# Patient Record
Sex: Male | Born: 1945 | Race: White | Hispanic: No | Marital: Married | Smoking: Never smoker
Health system: Southern US, Community
[De-identification: ages and names within clinical notes are randomized; demographics above are authoritative.]

## PROBLEM LIST (undated history)

## (undated) DIAGNOSIS — Z860100 Personal history of colon polyps, unspecified: Secondary | ICD-10-CM

## (undated) DIAGNOSIS — G250 Essential tremor: Secondary | ICD-10-CM

## (undated) DIAGNOSIS — N200 Calculus of kidney: Secondary | ICD-10-CM

## (undated) DIAGNOSIS — E785 Hyperlipidemia, unspecified: Secondary | ICD-10-CM

## (undated) DIAGNOSIS — M1990 Unspecified osteoarthritis, unspecified site: Secondary | ICD-10-CM

## (undated) DIAGNOSIS — G4730 Sleep apnea, unspecified: Secondary | ICD-10-CM

## (undated) DIAGNOSIS — N400 Enlarged prostate without lower urinary tract symptoms: Secondary | ICD-10-CM

## (undated) DIAGNOSIS — J9383 Other pneumothorax: Secondary | ICD-10-CM

## (undated) HISTORY — PX: TONSILLECTOMY: SUR1361

## (undated) HISTORY — DX: Personal history of colon polyps, unspecified: Z86.0100

## (undated) HISTORY — PX: VASECTOMY: SHX75

## (undated) HISTORY — DX: Other pneumothorax: J93.83

## (undated) HISTORY — DX: Essential tremor: G25.0

## (undated) HISTORY — DX: Personal history of colonic polyps: Z86.010

## (undated) HISTORY — DX: Calculus of kidney: N20.0

## (undated) HISTORY — DX: Benign prostatic hyperplasia without lower urinary tract symptoms: N40.0

## (undated) HISTORY — DX: Sleep apnea, unspecified: G47.30

## (undated) HISTORY — DX: Hyperlipidemia, unspecified: E78.5

## (undated) HISTORY — DX: Unspecified osteoarthritis, unspecified site: M19.90

---

## 1983-05-13 DIAGNOSIS — J9383 Other pneumothorax: Secondary | ICD-10-CM

## 1983-05-13 HISTORY — DX: Other pneumothorax: J93.83

## 2006-03-02 ENCOUNTER — Ambulatory Visit: Payer: Self-pay | Admitting: Family Medicine

## 2006-05-13 ENCOUNTER — Ambulatory Visit: Payer: Self-pay | Admitting: Family Medicine

## 2006-05-29 ENCOUNTER — Ambulatory Visit: Payer: Self-pay | Admitting: Gastroenterology

## 2006-06-12 ENCOUNTER — Encounter (INDEPENDENT_AMBULATORY_CARE_PROVIDER_SITE_OTHER): Payer: Self-pay | Admitting: *Deleted

## 2006-06-12 ENCOUNTER — Ambulatory Visit: Payer: Self-pay | Admitting: Gastroenterology

## 2006-06-12 LAB — HM COLONOSCOPY: HM Colonoscopy: NORMAL

## 2006-06-24 ENCOUNTER — Ambulatory Visit: Payer: Self-pay | Admitting: Family Medicine

## 2006-06-24 ENCOUNTER — Inpatient Hospital Stay (HOSPITAL_COMMUNITY): Admission: AD | Admit: 2006-06-24 | Discharge: 2006-06-26 | Payer: Self-pay | Admitting: Internal Medicine

## 2006-06-25 ENCOUNTER — Ambulatory Visit: Payer: Self-pay | Admitting: Internal Medicine

## 2006-06-27 ENCOUNTER — Emergency Department (HOSPITAL_COMMUNITY): Admission: EM | Admit: 2006-06-27 | Discharge: 2006-06-28 | Payer: Self-pay | Admitting: Emergency Medicine

## 2006-06-29 ENCOUNTER — Ambulatory Visit: Payer: Self-pay | Admitting: Family Medicine

## 2006-07-07 ENCOUNTER — Ambulatory Visit: Payer: Self-pay | Admitting: Internal Medicine

## 2006-09-01 ENCOUNTER — Encounter (INDEPENDENT_AMBULATORY_CARE_PROVIDER_SITE_OTHER): Payer: Self-pay | Admitting: Specialist

## 2006-09-01 ENCOUNTER — Ambulatory Visit (HOSPITAL_COMMUNITY): Admission: RE | Admit: 2006-09-01 | Discharge: 2006-09-01 | Payer: Self-pay | Admitting: Surgery

## 2006-09-01 HISTORY — PX: OTHER SURGICAL HISTORY: SHX169

## 2006-10-13 ENCOUNTER — Encounter: Payer: Self-pay | Admitting: *Deleted

## 2006-10-13 ENCOUNTER — Ambulatory Visit: Payer: Self-pay | Admitting: Family Medicine

## 2006-10-13 DIAGNOSIS — Z87442 Personal history of urinary calculi: Secondary | ICD-10-CM

## 2006-10-13 DIAGNOSIS — M069 Rheumatoid arthritis, unspecified: Secondary | ICD-10-CM | POA: Insufficient documentation

## 2006-10-13 DIAGNOSIS — Z87898 Personal history of other specified conditions: Secondary | ICD-10-CM

## 2006-11-20 ENCOUNTER — Ambulatory Visit: Payer: Self-pay | Admitting: Family Medicine

## 2006-12-08 ENCOUNTER — Encounter: Payer: Self-pay | Admitting: Family Medicine

## 2006-12-10 ENCOUNTER — Ambulatory Visit: Payer: Self-pay | Admitting: Family Medicine

## 2006-12-10 LAB — CONVERTED CEMR LAB
Nitrite: NEGATIVE
Specific Gravity, Urine: 1.025
WBC Urine, dipstick: NEGATIVE

## 2006-12-15 ENCOUNTER — Ambulatory Visit: Payer: Self-pay | Admitting: Family Medicine

## 2006-12-15 DIAGNOSIS — E785 Hyperlipidemia, unspecified: Secondary | ICD-10-CM | POA: Insufficient documentation

## 2006-12-15 DIAGNOSIS — N400 Enlarged prostate without lower urinary tract symptoms: Secondary | ICD-10-CM

## 2006-12-15 DIAGNOSIS — J309 Allergic rhinitis, unspecified: Secondary | ICD-10-CM | POA: Insufficient documentation

## 2006-12-15 DIAGNOSIS — Z860100 Personal history of colon polyps, unspecified: Secondary | ICD-10-CM | POA: Insufficient documentation

## 2006-12-15 LAB — CONVERTED CEMR LAB
AST: 21 units/L (ref 0–37)
Bilirubin, Direct: 0.1 mg/dL (ref 0.0–0.3)
Chloride: 103 meq/L (ref 96–112)
Eosinophils Absolute: 0.2 10*3/uL (ref 0.0–0.6)
Eosinophils Relative: 3.4 % (ref 0.0–5.0)
GFR calc non Af Amer: 81 mL/min
Glucose, Bld: 97 mg/dL (ref 70–99)
HCT: 40.5 % (ref 39.0–52.0)
Lymphocytes Relative: 30.7 % (ref 12.0–46.0)
MCV: 81.6 fL (ref 78.0–100.0)
Neutro Abs: 2.6 10*3/uL (ref 1.4–7.7)
Neutrophils Relative %: 55 % (ref 43.0–77.0)
PSA: 0.68 ng/mL (ref 0.10–4.00)
RBC: 4.96 M/uL (ref 4.22–5.81)
Sodium: 140 meq/L (ref 135–145)
Total Bilirubin: 0.6 mg/dL (ref 0.3–1.2)
Total Protein: 6.8 g/dL (ref 6.0–8.3)
WBC: 4.7 10*3/uL (ref 4.5–10.5)

## 2007-04-12 ENCOUNTER — Encounter: Payer: Self-pay | Admitting: Family Medicine

## 2007-12-07 ENCOUNTER — Ambulatory Visit: Payer: Self-pay | Admitting: Family Medicine

## 2007-12-07 LAB — CONVERTED CEMR LAB
Glucose, Urine, Semiquant: NEGATIVE
Nitrite: NEGATIVE
Protein, U semiquant: NEGATIVE
Urobilinogen, UA: 0.2
WBC Urine, dipstick: NEGATIVE

## 2007-12-08 LAB — CONVERTED CEMR LAB
ALT: 22 units/L (ref 0–53)
AST: 20 units/L (ref 0–37)
Albumin: 4.2 g/dL (ref 3.5–5.2)
Alkaline Phosphatase: 53 units/L (ref 39–117)
BUN: 22 mg/dL (ref 6–23)
Basophils Relative: 0.1 % (ref 0.0–3.0)
CO2: 32 meq/L (ref 19–32)
Chloride: 102 meq/L (ref 96–112)
Creatinine, Ser: 0.9 mg/dL (ref 0.4–1.5)
Eosinophils Relative: 4.4 % (ref 0.0–5.0)
LDL Cholesterol: 132 mg/dL — ABNORMAL HIGH (ref 0–99)
Lymphocytes Relative: 32.1 % (ref 12.0–46.0)
Neutrophils Relative %: 54.2 % (ref 43.0–77.0)
PSA: 0.7 ng/mL (ref 0.10–4.00)
Potassium: 4.3 meq/L (ref 3.5–5.1)
RBC: 4.75 M/uL (ref 4.22–5.81)
Total Bilirubin: 0.6 mg/dL (ref 0.3–1.2)
Total CHOL/HDL Ratio: 5.5
VLDL: 19 mg/dL (ref 0–40)
WBC: 4.5 10*3/uL (ref 4.5–10.5)

## 2007-12-16 ENCOUNTER — Ambulatory Visit: Payer: Self-pay | Admitting: Family Medicine

## 2008-01-04 ENCOUNTER — Telehealth: Payer: Self-pay | Admitting: Family Medicine

## 2008-03-10 ENCOUNTER — Ambulatory Visit: Payer: Self-pay | Admitting: Family Medicine

## 2008-03-10 DIAGNOSIS — J029 Acute pharyngitis, unspecified: Secondary | ICD-10-CM

## 2008-05-11 ENCOUNTER — Ambulatory Visit: Payer: Self-pay | Admitting: Family Medicine

## 2008-05-11 DIAGNOSIS — G250 Essential tremor: Secondary | ICD-10-CM

## 2008-05-11 DIAGNOSIS — G252 Other specified forms of tremor: Secondary | ICD-10-CM

## 2008-05-11 DIAGNOSIS — J019 Acute sinusitis, unspecified: Secondary | ICD-10-CM

## 2008-05-11 DIAGNOSIS — G4733 Obstructive sleep apnea (adult) (pediatric): Secondary | ICD-10-CM | POA: Insufficient documentation

## 2008-05-26 ENCOUNTER — Ambulatory Visit: Payer: Self-pay | Admitting: Pulmonary Disease

## 2008-05-28 ENCOUNTER — Encounter: Payer: Self-pay | Admitting: Pulmonary Disease

## 2008-05-28 ENCOUNTER — Ambulatory Visit (HOSPITAL_BASED_OUTPATIENT_CLINIC_OR_DEPARTMENT_OTHER): Admission: RE | Admit: 2008-05-28 | Discharge: 2008-05-28 | Payer: Self-pay | Admitting: Pulmonary Disease

## 2008-06-06 ENCOUNTER — Encounter: Payer: Self-pay | Admitting: Family Medicine

## 2008-06-09 ENCOUNTER — Ambulatory Visit: Payer: Self-pay | Admitting: Pulmonary Disease

## 2008-06-13 ENCOUNTER — Telehealth (INDEPENDENT_AMBULATORY_CARE_PROVIDER_SITE_OTHER): Payer: Self-pay | Admitting: *Deleted

## 2008-06-15 ENCOUNTER — Ambulatory Visit: Payer: Self-pay | Admitting: Pulmonary Disease

## 2008-07-26 ENCOUNTER — Telehealth: Payer: Self-pay | Admitting: Family Medicine

## 2008-07-31 ENCOUNTER — Telehealth: Payer: Self-pay | Admitting: Pulmonary Disease

## 2008-09-19 ENCOUNTER — Encounter: Payer: Self-pay | Admitting: Family Medicine

## 2009-01-02 ENCOUNTER — Ambulatory Visit: Payer: Self-pay | Admitting: Family Medicine

## 2009-01-02 LAB — CONVERTED CEMR LAB
Ketones, urine, test strip: NEGATIVE
Nitrite: NEGATIVE
Protein, U semiquant: NEGATIVE
Urobilinogen, UA: 0.2

## 2009-01-03 LAB — CONVERTED CEMR LAB
ALT: 24 units/L (ref 0–53)
Alkaline Phosphatase: 48 units/L (ref 39–117)
BUN: 25 mg/dL — ABNORMAL HIGH (ref 6–23)
Basophils Relative: 0 % (ref 0.0–3.0)
Bilirubin, Direct: 0 mg/dL (ref 0.0–0.3)
Calcium: 9.2 mg/dL (ref 8.4–10.5)
Chloride: 103 meq/L (ref 96–112)
Cholesterol: 194 mg/dL (ref 0–200)
Creatinine, Ser: 1 mg/dL (ref 0.4–1.5)
Eosinophils Relative: 2.9 % (ref 0.0–5.0)
GFR calc non Af Amer: 80.27 mL/min (ref >60)
HDL: 36.8 mg/dL — ABNORMAL LOW (ref >39.00)
LDL Cholesterol: 139 mg/dL — ABNORMAL HIGH (ref 0–99)
Lymphocytes Relative: 28.6 % (ref 12.0–46.0)
Monocytes Absolute: 0.5 10*3/uL (ref 0.1–1.0)
Monocytes Relative: 9.4 % (ref 3.0–12.0)
Neutrophils Relative %: 59.1 % (ref 43.0–77.0)
PSA: 0.8 ng/mL (ref 0.10–4.00)
Platelets: 212 10*3/uL (ref 150.0–400.0)
RBC: 4.98 M/uL (ref 4.22–5.81)
Total Bilirubin: 0.9 mg/dL (ref 0.3–1.2)
Total CHOL/HDL Ratio: 5
Total Protein: 7.2 g/dL (ref 6.0–8.3)
Triglycerides: 90 mg/dL (ref 0.0–149.0)
VLDL: 18 mg/dL (ref 0.0–40.0)
WBC: 4.9 10*3/uL (ref 4.5–10.5)

## 2009-01-10 ENCOUNTER — Ambulatory Visit: Payer: Self-pay | Admitting: Family Medicine

## 2009-04-11 ENCOUNTER — Telehealth: Payer: Self-pay | Admitting: Family Medicine

## 2009-06-22 ENCOUNTER — Ambulatory Visit: Payer: Self-pay | Admitting: Family Medicine

## 2009-06-22 DIAGNOSIS — IMO0002 Reserved for concepts with insufficient information to code with codable children: Secondary | ICD-10-CM

## 2009-06-22 DIAGNOSIS — S335XXA Sprain of ligaments of lumbar spine, initial encounter: Secondary | ICD-10-CM

## 2009-11-26 ENCOUNTER — Telehealth: Payer: Self-pay | Admitting: Family Medicine

## 2010-02-05 ENCOUNTER — Telehealth: Payer: Self-pay | Admitting: Family Medicine

## 2010-02-07 ENCOUNTER — Ambulatory Visit: Payer: Self-pay | Admitting: Family Medicine

## 2010-02-07 LAB — CONVERTED CEMR LAB
Blood in Urine, dipstick: NEGATIVE
Ketones, urine, test strip: NEGATIVE
Nitrite: NEGATIVE
Protein, U semiquant: NEGATIVE
Specific Gravity, Urine: 1.015
WBC Urine, dipstick: NEGATIVE
pH: 6.5

## 2010-02-11 LAB — CONVERTED CEMR LAB
Albumin: 4.4 g/dL (ref 3.5–5.2)
Alkaline Phosphatase: 56 units/L (ref 39–117)
Basophils Absolute: 0 10*3/uL (ref 0.0–0.1)
Bilirubin, Direct: 0.1 mg/dL (ref 0.0–0.3)
CO2: 34 meq/L — ABNORMAL HIGH (ref 19–32)
Calcium: 9.3 mg/dL (ref 8.4–10.5)
Creatinine, Ser: 1.2 mg/dL (ref 0.4–1.5)
Eosinophils Absolute: 0.2 10*3/uL (ref 0.0–0.7)
GFR calc non Af Amer: 64.81 mL/min (ref >60)
Glucose, Bld: 100 mg/dL — ABNORMAL HIGH (ref 70–99)
HDL: 41.8 mg/dL (ref >39.00)
Lymphocytes Relative: 26.1 % (ref 12.0–46.0)
MCHC: 34.2 g/dL (ref 30.0–36.0)
Monocytes Relative: 9.4 % (ref 3.0–12.0)
Neutrophils Relative %: 61.2 % (ref 43.0–77.0)
RDW: 14.1 % (ref 11.5–14.6)
Total Bilirubin: 0.8 mg/dL (ref 0.3–1.2)
Total CHOL/HDL Ratio: 5
Triglycerides: 123 mg/dL (ref 0.0–149.0)
VLDL: 24.6 mg/dL (ref 0.0–40.0)

## 2010-02-14 ENCOUNTER — Ambulatory Visit: Payer: Self-pay | Admitting: Family Medicine

## 2010-02-14 ENCOUNTER — Encounter: Payer: Self-pay | Admitting: Family Medicine

## 2010-02-15 ENCOUNTER — Ambulatory Visit: Payer: Self-pay | Admitting: Family Medicine

## 2010-02-15 ENCOUNTER — Telehealth: Payer: Self-pay | Admitting: Family Medicine

## 2010-03-05 ENCOUNTER — Encounter: Payer: Self-pay | Admitting: Family Medicine

## 2010-03-14 ENCOUNTER — Telehealth (INDEPENDENT_AMBULATORY_CARE_PROVIDER_SITE_OTHER): Payer: Self-pay | Admitting: *Deleted

## 2010-03-18 ENCOUNTER — Ambulatory Visit: Payer: Self-pay | Admitting: Family Medicine

## 2010-04-30 ENCOUNTER — Telehealth: Payer: Self-pay | Admitting: Family Medicine

## 2010-04-30 DIAGNOSIS — M79609 Pain in unspecified limb: Secondary | ICD-10-CM

## 2010-05-12 HISTORY — PX: BUNIONECTOMY: SHX129

## 2010-06-11 NOTE — Progress Notes (Signed)
Summary: please return his call  Phone Note Call from Patient Call back at [phone]   Caller: Patient---live call Reason for Call: Talk to Nurse, Lab or Test Results Summary of Call: wants Almira Coaster to call him regarding his lab results that he picked up from the office. Initial call taken by: Warnell Forester,  March 14, 2010 4:46 PM  Follow-up for Phone Call        Pt wants info on his lipomed results.  Follow-up by: Romualdo Bolk, CMA Duncan Dull),  March 15, 2010 8:59 AM  Additional Follow-up for Phone Call Additional follow up Details #1::        this is very difficult to discuss over the phone. If he would like to make an appt. I would be able to go over it with him  Additional Follow-up by: Nelwyn Salisbury MD,  March 15, 2010 11:01 AM    Additional Follow-up for Phone Call Additional follow up Details #2::    FYI: Pt states the order says non fasting. Pt states this is not true he fasted for 15 hours. Will schedule pt an appt. Follow-up by: Josph Macho RMA,  March 15, 2010 11:33 AM

## 2010-06-11 NOTE — Progress Notes (Signed)
Summary: Pt wants Electrophoresis lab done?  Phone Note Call from Patient Call back at Home Phone [phone] Call back at [phone]   Caller: Patient---voice mail Summary of Call: scheduled for cpx labs this thursday. requesting to have a special test to determined the particle size of the LDL. Has had high LDL reading in the past. Initial call taken by: Warnell Forester,  February 05, 2010 10:12 AM  Follow-up for Phone Call        Pt called today requesting Protein Electrophoresis be added to labs to be done this thursday due to High LDL readings. Do you want to add lab work? Pt requesting a call back on cell [phone] Follow-up by: Kathrynn Speed CMA,  February 05, 2010 11:59 AM  Additional Follow-up for Phone Call Additional follow up Details #1::        I do not order these because I do not find them useful. They are theoreticaly useful for people with normal LDLs that are deceptively dangerous. His LDLs are already known to be high, so we can discuss whether to start on meds for this or not. This extra test would not really help [place]  Additional Follow-up by: Nelwyn Salisbury MD,  February 05, 2010 1:19 PM    Additional Follow-up for Phone Call Additional follow up Details #2::    pt notiifed. Follow-up by: Pura Spice, RN,  February 06, 2010 8:03 AM

## 2010-06-11 NOTE — Assessment & Plan Note (Signed)
Summary: Follow up on labs/ CF   Vital Signs:  Patient profile:   65 year old male Pulse rate:   74 / minute BP sitting:   134 / 84  (left arm)  Vitals Entered By: Pura Spice, RN (March 18, 2010 1:36 PM) CC: discuss labs   History of Present Illness: Here to discuss results from a recent lipoprofile test he did, and to revisit out discussion about whether to go on a statin or not. He continues to exercise and eat healthily. His profile showed his LDL particle number to be a little high, but the particle size and density is large and fluffy, which is a good trend.    Allergies: No Known Drug Allergies  Past History:  Past Medical History: Reviewed history from 01/10/2009 and no changes required. Allergic rhinitis Hyperlipidemia Benign prostatic hypertrophy degenerative arthritis Colonic polyps, hx of Nephrolithiasis, hx of spontaneous pneumothorax 1985 sees Dr. Terri Piedra for skin checks sleep apnea, sees Dr. Marcelyn Bruins right arm essential tremor, sees Dr. Lesia Sago  Review of Systems  The patient denies anorexia, fever, weight loss, weight gain, vision loss, decreased hearing, hoarseness, chest pain, syncope, dyspnea on exertion, peripheral edema, prolonged cough, headaches, hemoptysis, abdominal pain, melena, hematochezia, severe indigestion/heartburn, hematuria, incontinence, genital sores, muscle weakness, suspicious skin lesions, transient blindness, difficulty walking, depression, unusual weight change, abnormal bleeding, enlarged lymph nodes, angioedema, breast masses, and testicular masses.    Physical Exam  General:  Well-developed,well-nourished,in no acute distress; alert,appropriate and cooperative throughout examination Lungs:  Normal respiratory effort, chest expands symmetrically. Lungs are clear to auscultation, no crackles or wheezes. Heart:  Normal rate and regular rhythm. S1 and S2 normal without gallop, murmur, click, rub or other extra  sounds.   Impression & Recommendations:  Problem # 1:  HYPERLIPIDEMIA (ICD-272.4)  Complete Medication List: 1)  Multivitamins Tabs (Multiple vitamin) .... Once daily 2)  Flonase 50 Mcg/act Susp (Fluticasone propionate) .... 2 sprays each nostril once daily 3)  Saw Palmetto 80 Mg Caps (Saw palmetto (serenoa repens)) 4)  Omega 3 Fish Oil  5)  Max Glucosamine Chondroitin 500-400 Mg Tabs (Glucosamine-chondroitin) .... Two times a day  Patient Instructions: 1)  After weighing the pros and cons we decided together that we would not start on meds at this time. recheck as scheduled   Orders Added: 1)  Est. Patient Level III [60454]

## 2010-06-11 NOTE — Assessment & Plan Note (Signed)
Summary: cpx/njr   Vital Signs:  Patient profile:   65 year old male Height:      70 inches Weight:      188 pounds O2 Sat:      98 % Temp:     97.9 degrees F Pulse rate:   54 / minute BP sitting:   130 / 82  (left arm) Cuff size:   large  Vitals Entered By: Pura Spice, RN (February 14, 2010 10:47 AM) CC: cpx refill flonase    History of Present Illness: 65 yr old male for a cpx. He feels fine and has no complaints. He has had a mildly elevated LDL in the 130s for years despite watching his diet very closely. He is interested in getting a lipoprotein electrophoresis study done to further assess his risk.   Allergies: No Known Drug Allergies  Past History:  Past Medical History: Reviewed history from 01/10/2009 and no changes required. Allergic rhinitis Hyperlipidemia Benign prostatic hypertrophy degenerative arthritis Colonic polyps, hx of Nephrolithiasis, hx of spontaneous pneumothorax 1985 sees Dr. Terri Piedra for skin checks sleep apnea, sees Dr. Marcelyn Bruins right arm essential tremor, sees Dr. Lesia Sago  Past Surgical History: Reviewed history from 12/16/2007 and no changes required. Tonsillectomy excision of lipoma from left neck 09-01-06 per Dr. Michaell Cowing Vasectomy Colonoscopy 06/12/06 per Dr. Jarold Motto, repeat in 5 yrs  Family History: Reviewed history from 12/16/2007 and no changes required. Rheumatoid arthritis & osteo arthritis Family History of Alcoholism/Addiction Family History of CAD Male 1st degree relative <50 Family History Hypertension Mesenteric Caner  Social History: Reviewed history from 12/16/2007 and no changes required. Married Former smoker Alcohol use-yes Drug use-no Occupation:professor at [employer], retired Personnel officer  Review of Systems  The patient denies anorexia, fever, weight loss, weight gain, vision loss, decreased hearing, hoarseness, chest pain, syncope, dyspnea on exertion, peripheral edema, prolonged cough, headaches,  hemoptysis, abdominal pain, melena, hematochezia, severe indigestion/heartburn, hematuria, incontinence, genital sores, muscle weakness, suspicious skin lesions, transient blindness, difficulty walking, depression, unusual weight change, abnormal bleeding, enlarged lymph nodes, angioedema, breast masses, and testicular masses.         Flu Vaccine Consent Questions     Do you have a history of severe allergic reactions to this vaccine? no    Any prior history of allergic reactions to egg and/or gelatin? no    Do you have a sensitivity to the preservative Thimersol? no    Do you have a past history of Guillan-Barre Syndrome? no    Do you currently have an acute febrile illness? no    Have you ever had a severe reaction to latex? no    Vaccine information given and explained to patient? yes    Are you currently pregnant? no    Lot Number:[id number]   Exp Date:11/09/2010   Site Given  Left Deltoid IM Pura Spice, RN  February 14, 2010 10:53 AM    Physical Exam  General:  Well-developed,well-nourished,in no acute distress; alert,appropriate and cooperative throughout examination Head:  Normocephalic and atraumatic without obvious abnormalities. No apparent alopecia or balding. Eyes:  No corneal or conjunctival inflammation noted. EOMI. Perrla. Funduscopic exam benign, without hemorrhages, exudates or papilledema. Vision grossly normal. Ears:  External ear exam shows no significant lesions or deformities.  Otoscopic examination reveals clear canals, tympanic membranes are intact bilaterally without bulging, retraction, inflammation or discharge. Hearing is grossly normal bilaterally. Nose:  External nasal examination shows no deformity or inflammation. Nasal mucosa are pink and moist  without lesions or exudates. Mouth:  Oral mucosa and oropharynx without lesions or exudates.  Teeth in good repair. Neck:  No deformities, masses, or tenderness noted. Chest Wall:  No deformities, masses, tenderness  or gynecomastia noted. Lungs:  Normal respiratory effort, chest expands symmetrically. Lungs are clear to auscultation, no crackles or wheezes. Heart:  Normal rate and regular rhythm. S1 and S2 normal without gallop, murmur, click, rub or other extra sounds. EKG normal Abdomen:  Bowel sounds positive,abdomen soft and non-tender without masses, organomegaly or hernias noted. Rectal:  No external abnormalities noted. Normal sphincter tone. No rectal masses or tenderness. Heme neg.  Genitalia:  Testes bilaterally descended without nodularity, tenderness or masses. No scrotal masses or lesions. No penis lesions or urethral discharge. Prostate:  no nodules, no asymmetry, no induration, and 1+ enlarged.   Msk:  No deformity or scoliosis noted of thoracic or lumbar spine.   Pulses:  R and L carotid,radial,femoral,dorsalis pedis and posterior tibial pulses are full and equal bilaterally Extremities:  No clubbing, cyanosis, edema, or deformity noted with normal full range of motion of all joints.   Neurologic:  No cranial nerve deficits noted. Station and gait are normal. Plantar reflexes are down-going bilaterally. DTRs are symmetrical throughout. Sensory, motor and coordinative functions appear intact. Skin:  Intact without suspicious lesions or rashes Cervical Nodes:  No lymphadenopathy noted Axillary Nodes:  No palpable lymphadenopathy Inguinal Nodes:  No significant adenopathy Psych:  Cognition and judgment appear intact. Alert and cooperative with normal attention span and concentration. No apparent delusions, illusions, hallucinations   Impression & Recommendations:  Problem # 1:  EXAMINATION, ROUTINE MEDICAL (ICD-V70.0)  Orders: EKG w/ Interpretation (93000) Hemoccult Guaiac-1 spec.(in office) (82270)  Complete Medication List: 1)  Multivitamins Tabs (Multiple vitamin) .... Once daily 2)  Flonase 50 Mcg/act Susp (Fluticasone propionate) .... 2 sprays each nostril once daily 3)  Saw  Palmetto 80 Mg Caps (Saw palmetto (serenoa repens)) 4)  Omega 3 Fish Oil  5)  Max Glucosamine Chondroitin 500-400 Mg Tabs (Glucosamine-chondroitin) .... Two times a day  Other Orders: Flu Vaccine 55yrs + MEDICARE PATIENTS (M8413) Administration Flu vaccine - MCR (K4401)  Patient Instructions: 1)  he will set up a lipoprotein electrophoresis study soon Prescriptions: FLONASE 50 MCG/ACT SUSP (FLUTICASONE PROPIONATE) 2 sprays each nostril once daily  #30 x 11   Entered and Authorized by:   Nelwyn Salisbury MD   Signed by:   Nelwyn Salisbury MD on 02/14/2010   Method used:   Electronically to        [facility] [address] (retail)       [address]       [place], [place]  [address]       Ph: [phone]       Fax: [phone]   RxID:   [id number]

## 2010-06-11 NOTE — Progress Notes (Signed)
Summary: Call A Nurse   Call-A-Nurse Triage Call Report Triage Record Num: [id number] Operator: Dayton Martes Patient Name: Louis Ortega Call Date & Time: 02/14/2010 5:13:19PM Patient Phone: [phone] PCP: Patient Gender: Male PCP Fax : Patient DOB: May 02, 1946 Practice Name: [facility] Reason for Call: Pt sts that he is having blood work done in the am and has to fast. Pt wants to know if he can take Advil PM to help him sleep. RN adv that pt may take meds as dir. Protocol(s) Used: Office Note Recommended Outcome per Protocol: Information Noted and Sent to Office Reason for Outcome: Caller information to office Care Advice:

## 2010-06-11 NOTE — Progress Notes (Signed)
Summary: Severe Abdominal Pain  Phone Note Call from Patient Call back at Home Phone [phone]   Caller: Patient Summary of Call: Has been taking advil pm to help with sleeping.  Severe hunger pain in stomach this morning, ate breakfast no relief also felt dizzy.  Is this something I should just monitor or pursue aggressively?  Initial call taken by: Trixie Dredge,  November 26, 2009 2:15 PM  Follow-up for Phone Call        how is he now? Follow-up by: Nelwyn Salisbury MD,  November 26, 2009 5:19 PM  Additional Follow-up for Phone Call Additional follow up Details #1::        Better now, but still uncomfortable. BM's ok. Pain epigastric. stop advil, take Prilosec OTC and come in if no better. Additional Follow-up by: Raechel Ache, RN,  November 26, 2009 5:25 PM

## 2010-06-11 NOTE — Assessment & Plan Note (Signed)
Summary: pain in groin/hip and lower leg/cjr   Vital Signs:  Patient profile:   65 year old male Weight:      193 pounds BMI:     27.79 O2 Sat:      95 % Temp:     98 degrees F Pulse rate:   70 / minute BP sitting:   136 / 80  Vitals Entered By: Pura Spice, RN (June 22, 2009 8:59 AM) CC: c/o pain rt hip ,groin area. onset 4 days. stated exercised differently on sunday and noticed pain on monday Is Patient Diabetic? No   History of Present Illness: One week ago he had the sudden onset of sharp pains in the right lower back and the right groin. This started the am after he dd  a workout including lower body lunges. This particular workout he rushed through because he was short on time. Each day since the pains have been slowly improving. Motrin helps.   Preventive Screening-Counseling & Management  Alcohol-Tobacco     Year Quit: 1989  Allergies (verified): No Known Drug Allergies  Past History:  Past Medical History: Reviewed history from 01/10/2009 and no changes required. Allergic rhinitis Hyperlipidemia Benign prostatic hypertrophy degenerative arthritis Colonic polyps, hx of Nephrolithiasis, hx of spontaneous pneumothorax 1985 sees Dr. Terri Piedra for skin checks sleep apnea, sees Dr. Marcelyn Bruins right arm essential tremor, sees Dr. Lesia Sago  Past Surgical History: Reviewed history from 12/16/2007 and no changes required. Tonsillectomy excision of lipoma from left neck 09-01-06 per Dr. Michaell Cowing Vasectomy Colonoscopy 06/12/06 per Dr. Jarold Motto, repeat in 5 yrs  Review of Systems  The patient denies anorexia, fever, weight loss, weight gain, vision loss, decreased hearing, hoarseness, chest pain, syncope, dyspnea on exertion, peripheral edema, prolonged cough, headaches, hemoptysis, abdominal pain, melena, hematochezia, severe indigestion/heartburn, hematuria, incontinence, genital sores, muscle weakness, suspicious skin lesions, transient blindness, difficulty  walking, depression, unusual weight change, abnormal bleeding, enlarged lymph nodes, angioedema, breast masses, and testicular masses.    Physical Exam  General:  Well-developed,well-nourished,in no acute distress; alert,appropriate and cooperative throughout examination Msk:  tender in the right lower back and the right groin. No masses. Full ROM.   Impression & Recommendations:  Problem # 1:  GROIN STRAIN (ICD-848.8)  Problem # 2:  LUMBAR STRAIN (ICD-847.2)  Complete Medication List: 1)  Multivitamins Tabs (Multiple vitamin) .... Once daily 2)  Flonase 50 Mcg/act Susp (Fluticasone propionate) .... 2 sprays each nostril once daily 3)  Saw Palmetto 80 Mg Caps (Saw palmetto (serenoa repens)) 4)  Omega 3 Fish Oil   Patient Instructions: 1)  These should heal with time. take it easy this weekend. ease back into exercising slowly.

## 2010-06-13 NOTE — Progress Notes (Signed)
Summary: REQUEST FOR REFERRAL?  Phone Note Call from Patient   Caller: Patient   [phone] Summary of Call: Pt would like a referral to a Podiatrist in the [facility]...Marland KitchenMarland Kitchen Pt has had a problem with his foot hurting and has had edema / redness / inflammation to site (side of foot) - bunion pain?... Pt can be reached at  [phone].  Initial call taken by: Debbra Riding,  April 30, 2010 11:17 AM  Follow-up for Phone Call        refer to Dr. Orlene Och  Follow-up by: Nelwyn Salisbury MD,  May 01, 2010 8:40 AM  Additional Follow-up for Phone Call Additional follow up Details #1::        referral sent to terri  Additional Follow-up by: Pura Spice, RN,  May 01, 2010 9:28 AM  New Problems: FOOT PAIN (ICD-729.5)   New Problems: FOOT PAIN (ICD-729.5)

## 2010-09-24 NOTE — Procedures (Signed)
NAME:  GUSSIE, TOWSON NO.:  [id number]   MEDICAL RECORD NO.:  [id number]          PATIENT TYPE:  OUT   LOCATION:  SLEEP CENTER                 FACILITY:  [facility]   PHYSICIAN:  Barbaraann Share, MD,FCCPDATE OF BIRTH:  November 23[birth year] 1945   DATE OF STUDY:  05/28/2008                            NOCTURNAL POLYSOMNOGRAM   REFERRING PHYSICIAN:  Barbaraann Share, MD,FCCP   LOCATION:  Sleep Lab.   REFERRING PHYSICIAN:  Barbaraann Share, MD,FCCP   INDICATION FOR THE STUDY:  Hypersomnia with sleep apnea.   EPWORTH SCORE:  8.   SLEEP ARCHITECTURE:  The patient had a total sleep time of 326 minutes  with no slow wave sleep and decreased quality of REM.  Sleep onset  latency was normal at 6 minutes and REM onset was normal at 92 minutes.  Sleep efficiency was decreased at 81%.   RESPIRATORY DATA:  The patient was found to have 43 apneas and 20  hypopneas for an apnea-hypopnea index of 12 events per hour.  Almost all  of his events occurred in the supine position at the very end of the  study, and there was loud snoring noted throughout.   OXYGEN DATA:  There was O2 desaturation as low as 80% with his  obstructive events.   CARDIAC DATA:  No clinically significant arrhythmia noted.   MOVEMENT/PARASOMNIA:  The patient was found to have small to moderate  numbers of leg jerks, but no significant sleep disruption.   IMPRESSION/RECOMMENDATION:  Mild obstructive sleep apnea with an apnea-  hypopnea index of 12 events per hour, and oxygen desaturation as low as  80%.  However, almost all of the events occurred in the supine position  at the end of the study, and it is obvious that he has very severe sleep  apnea when he is in the supine position.  Clinical correlation is suggested.  Treatment for this degree of sleep  apnea can include weight loss alone if applicable, upper airway surgery,  oral appliance, CPAP, and finally positional therapy.      Barbaraann Share, MD,FCCP  Diplomate, American Board of Sleep  Medicine  Electronically Signed     KMC/MEDQ  D:  06/10/2008 14:26:18  T:  06/11/2008 02:46:08  Job:  [id number]

## 2010-09-27 NOTE — H&P (Signed)
NAME:  Louis Ortega, Louis Ortega NO.:  [id number]   MEDICAL RECORD NO.:  [id number]          PATIENT TYPE:  INP   LOCATION:  5710                         FACILITY:  [facility]   PHYSICIAN:  Tera Mater. Clent Ridges, MD     DATE OF BIRTH:  February 16, 1946   DATE OF ADMISSION:  06/24/2006  DATE OF DISCHARGE:                              HISTORY & PHYSICAL   CHIEF COMPLAINT:  This is a 65 year old male complaining of generalized  weakness and 24 hours of bloody diarrhea.   HISTORY OF PRESENT ILLNESS:  The patient had a colonoscopy, per Dr.  Sheryn Bison on June 12, 2006, showing a few benign polyps and a  large ulcer in the distal ileum.  Biopsies of this area showed non-  specific inflammation and no signs of malignancy.  Patient did well,  until last night when he developed a sudden onset of bloody diarrhea at  home.  He continued to have bloody diarrhea all night and through the  morning.  This morning, he drove himself to our clinic this morning,  although he is shaky and is extremely weak.  He needed to be assisted  back to my examination room with a wheel chair, due to difficulty  walking.  He denies any nausea or vomiting.  He denies any abdominal  pain.  He has had no fever.  He denies any recent foreign travel, no  drinking of well water, etc.  He has had no medications over the past  year, except for some recent Darvocet or Vicodin for pain related to  some dental procedure and a vasectomy he has had over the past several  weeks.   PAST MEDICAL HISTORY:  He had a vasectomy one week ago.  He has benign  prostatic hypertrophy.  He was diagnosed with a mild form of rheumatoid  arthritis about 10 years ago, which primarily manifests as stiffness and  pain in his hands and wrists.  He does not take medication for it.  He  has a history of high cholesterol which is managed with diet.  He has  had kidney stones.  He had a spontaneous pneumothorax, which required  placement of a chest  tube some years ago.  He has had a tonsillectomy.   ALLERGIES:  None.   CURRENT MEDICATIONS:  None on an ongoing basis, although he has had some  Darvocet and Vicodin recently.  He specifically denies taking  nonsteroidal anti-inflammatory medications for any reason over the past  year.   HABITS:  He drinks some occasional wine.  He quit smoking cigarettes 10  years ago.   SOCIAL HISTORY:  He is married.  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   FAMILY HISTORY:  Remarkable for rheumatoid arthritis and osteoarthritis  in his mother.  There is some hypertension, heart disease and alcoholism  present in his family as well.   EXAMINATION:  VITAL SIGNS:  Weight 194 pounds, height 5 foot 11 inches,  pulse 78 and regular, respirations 12 and comfortable, blood pressure  100/68, temperature 97.2 degrees.  GENERAL:  He is alert but very weak.  He requires assistance to walk.  He is somewhat pale.  SKIN:  Warm and dry.  HEAD, EARS, EYES, NOSE AND THROAT:  Within normal limits  NECK:  Shows no thyromegaly or masses.  LUNGS:  Clear.  CARDIAC:  Regular rhythm and rate without gallops, murmurs or rubs.  Distal pulses are full.  ABDOMEN:  Soft, normal bowel sounds, nontender, no masses, no  hepatosplenomegaly.  RECTAL:  No masses or tenderness are noted.  There is frankly positive  blood in the vault.  EXTREMITIES:  No clubbing, cyanosis or edema.  NEUROLOGIC:  Exam is grossly intact.   An EKG is within normal limits.  A fingerstick hemoglobin here in the  office is 11.2 (I do not have a baseline to compare this with).   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  He will be admitted  for monitoring and for IV hydration.  We will consult [facility] to help [place] out in this matter.  On further  questioning,  with his history of rheumatoid arthritis and intestinal ulcers, he also  tells me he frequently gets aphthous ulcers in the mouth.  One wonders  if Behcet's syndrome or other autoimmune syndromes could explain some of  these issues.      Tera Mater. Clent Ridges, MD  Electronically Signed     SAF/MEDQ  D:  06/24/2006  T:  06/24/2006  Job:  [id number]

## 2010-09-27 NOTE — Assessment & Plan Note (Signed)
[facility] OFFICE NOTE   JAKAIDEN, FILL                        MRN:          [id number]  DATE:03/02/2006                            DOB:          April 27, 1946    REASON FOR VISIT:  This is a 65 year old gentleman here to establish with  our practice.  He has several things to discuss today, but nothing of an  urgent nature.   HISTORY:  The patient moved to [place] from [place] last year for work  reasons and is very happy.  He moved from [place], [place] primarily because  of job advantages for his wife and himself.   About 14 or 15 years ago a small fleshy lump came up on his left anterior  and is completely asymptomatic.  It has not been growing or changing.  He  was told it was a fatty tumor.  He would like to have this removed at some  point, but would like to wait until next year.  This is true with a number  of issues that we will discuss as noted later during this note.  Next year  he will have a medical flexible spending account and would like to have a  lot of these things dealt with at that time.  He also brings to my attention  an intermittent tremor in the right hand that has been going on for about  six years.  It does not appear to be getting worse.  It seems to be an  intentional tremor in that when he is holding a cup of water to drink,  working on his computer, etc sometimes his right hand will begin to shake a  small amount uncontrollably.  This usually only lasts a few seconds or a few  minutes and then goes away.  He has never noticed this at rest.  No other  symptoms are involved in the hands, specifically no pain, no swelling, no  numbness or tingling, etc.  The patient and his wife have also decided that  they do not want to have children and he is interested in having a vasectomy  next year.  He is also a little past due for a colonoscopy and would like to  have that done next  year as well.   PAST MEDICAL HISTORY:  Other past medical history includes:  1. Benign prostatic enlargement.  He was on a medication briefly last      year, which gave him some sexual side effect, so he stopped it.  He      does not remember the name of it, but I believe we have established      that it was Avodart.  He currently has nocturia about once or twice a      night and does not feel he needs any medications at all.  His last      physical was a little over a year ago.  2. 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 His mother has a lot of arthritic      problems as noted below; and, then when he developed pains in his hands      and wrists he saw his physician in [place].  He primarily gets      intermittent pain around the metacarpophalangeal joints of both hands,      around the bases of both thumbs and around both wrists.  There has      never been swelling.  It has never been a type of pain that he could      not control with some ibuprofen over-the-counter.  Currently he feels      fine.   The patient did have some blood work done in May of this year before he left  [place].  This showed a borderline elevated rheumatoid factor of 14.   1. The patient has a history of elevated cholesterol, but has never been      on medication for it.  His LDL was 118 in May 2007.  2. The patient has been treated for some kidney stones in the past.  3. The patient was treated in 1985 with a chest tube for a spontaneous      pneumothorax.  4. The patient has had a tonsillectomy.   IMMUNIZATIONS:  The patient had a tetanus booster in 2000.   HEALTH MAINTENANCE:  Colon screening; he had a normal colonoscopy in 2001.   ALLERGIES:  None.   MEDICATIONS:  None.   HABITS:  The quit smoking 10 years ago.  He does drink some alcohol.   SOCIAL HISTORY:  The patient is married with no children.  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   FAMILY HISTORY:  The family history is remarkable for rheumatoid arthritis  as well as osteoarthritis in his mother.  Alcoholism in numerous family  members including aunts, uncles and grandparents.  His father had colon  cancer and also heart disease.  There are also blood pressure problems in  his family.   OBJECTIVE:  VITAL SIGNS:  Height 5 feet 11 inches,  weight 193 pounds.  BP  132/98, pulse 64 and regular.  GENERAL APPEARANCE:  In general he appears to be quite healthy  SKIN:  The skin exam reveals a small, nontender, fleshy, mobile lump in the  left anterior neck consistent with a lipoma.  Evaluation of his hands shows  a couple of small, bony slightly tender nodular areas on the medial and  lateral sides of his right second metacarpophalangeal consistent with  rheumatoid nodules.  Otherwise there is no synovial swelling and no  tenderness.  NEUROLOGIC:  The neurologic exam is grossly intact today.  I see no evidence  of tremors either intentional or at rest.   ASSESSMENT AND PLAN:  1. 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   I will set him up with someone to have this removed next year.   1. Colon screening.   I will set him up for a colonoscopy next year.   1. Desire for a vasectomy.   I will refer him to urology next year.   1. 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   I will observe this closely and evaluate further next year.            ______________________________  Tera Mater. Clent Ridges, MD     SAF/MedQ  DD:  03/02/2006  DT:  03/03/2006  Job #:  [id number]

## 2010-09-27 NOTE — Op Note (Signed)
NAME:  Louis Ortega, Louis Ortega NO.:  [id number]   MEDICAL RECORD NO.:  [id number]          PATIENT TYPE:  AMB   LOCATION:  DAY                          FACILITY:  [facility]   PHYSICIAN:  Ardeth Sportsman, MD     DATE OF BIRTH:  [birth year]   DATE OF PROCEDURE:  09/01/2006  DATE OF DISCHARGE:                               OPERATIVE REPORT   SURGEON:  Ardeth Sportsman, M.D.   ASSISTANT:  None.   PREOPERATIVE DIAGNOSIS:  Left neck mass.   POSTOPERATIVE DIAGNOSIS:  Left subplatysmal neck mass, probable lipoma.   PROCEDURE PERFORMED:  Superficial left neck exploration and excision of  left neck mass.   ANESTHESIA:  1. General.  2. Local anesthetic as a field block around the incision.   SPECIMENS:  Left neck mass, probable lipoma.   DRAINS:  None.   ESTIMATED BLOOD LOSS:  Less than 5 mL.   COMPLICATIONS:  None apparent.   INDICATIONS FOR PROCEDURE:  Mr. Huelsmann is a 65 year old male who has had  a neck mass for about 15 years that is not getting markedly increased in  size but has been somewhat bothersome.  Based on these concerns, the  recommendation was made for excision.  The technique of the excision was  discussed.  Because it is on the neck, I was worried about doing this in  the office and  wanted this under a more controlled situation.  The  technique of excision was explained.  The risks such as need for deeper  neck exploration, injury to other organs, stroke, heart attack,  pulmonary embolism, and death were discussed.  The risks such as  bleeding, hematoma, ecchymosis, wound infection, abscess, and other  risks were discussed.  Questions were answered and he agreed to proceed.   OPERATIVE FINDINGS:  He had a fatty mass about 15 by 15 mm in size  within and under the platysma that appeared to be most consistent with a  lipoma.   DESCRIPTION OF PROCEDURE:  Informed consent was confirmed.  The patient  received IV antibiotics.  He had sequential compressive  devices active  during the entire case.  He underwent general anesthesia without  difficulty.  He was positioned supine with his left arm tucked.  His  left neck, face, and chest were prepped and draped in a sterile fashion.  A field block was placed.   An oblique incision within the nature neck creases was made about 2 cm  total.  Dissection was able to get through the skin.  A careful  controlled dissection was done in the soft tissues and I encountered the  mass.  It was circumferentially dissected free and found to be connected  to the platysma.  A nick was made within the platysma parallel to this  and I was able to get underneath it rather easily.  Circumferential  dissection was done to excise the mass taking a little bit of platysma  muscle with it.  Excellent hemostasis was maintained.  The specimen was  labeled with a short stitch superior and a long stitch lateral.  Wound  inspection revealed excellent hemostasis.   The platysma was reapproximated using 2-0 Vicryl figure-of-eight  stitches x2.  The skin was closed using a 4-0 Monocryl running  subcuticular stitch.  A sterile dressing was applied.  The patient had a  little bit of bradycardia down to the 30s with no hypotension at the end  of the case but reversed with interventions by anesthesia and, again,  was normotensive at the end of the case.  There were no ST or T changes.   I went to talk to the family but they could not be found.  I did explain  postoperative instructions to the patient just prior to surgery and  wrote them down.      Ardeth Sportsman, MD  Electronically Signed     SCG/MEDQ  D:  09/01/2006  T:  09/01/2006  Job:  [id number]   cc:   Tera Mater. Clent Ridges, MD  [address] [place]  [place] [address]

## 2010-09-27 NOTE — Consult Note (Signed)
NAME:  Louis Ortega, Louis Ortega NO.:  [id number]   MEDICAL RECORD NO.:  [id number]          PATIENT TYPE:  EMS   LOCATION:  ED                           FACILITY[facility]  [facility]   PHYSICIAN:  Georgiana Spinner, M.D.    DATE OF BIRTH:  17-Dec-1945   DATE OF CONSULTATION:  DATE OF DISCHARGE:                                 CONSULTATION   PHONE CONVERSATION:  [name] states he was just released from the  hospital this week after suffering a post colonoscopy polypectomy bleed.  He states he has otherwise been well with no significant other past  medical history.  He noticed today after eating dinner that he passed a  stool that was mostly blood.  It was bright red and called.  He said he  was not dizzy or lightheaded.  He had no chest pain and did not want to  come in immediately to be seen.  I told him that, therefore, he had two  options.  One is he could come into an urgent care center to get checked  for blood count and a blood pressure.  Alternatively, I told him, and we  agreed, that if he had one more bloody stool that he would have to come  in to the emergency room and possibly be admitted.  He tried to  negotiate to come in, in the morning if he tolerated any more bleeding  and I told him that was absolutely not to be the case and that if he had  one more stool he needed to come in and that was what we agreed upon at  this point.           ______________________________  Georgiana Spinner, M.D.     GMO/MEDQ  D:  06/27/2006  T:  06/28/2006  Job:  [id number]   cc:   Jeanelle Malling GI

## 2010-09-27 NOTE — Assessment & Plan Note (Signed)
[facility]                         GASTROENTEROLOGY OFFICE NOTE   Louis Ortega, Louis Ortega                        MRN:          [id number]  DATE:06/24/2006                            DOB:          1946/02/21    Louis Ortega is a 65 year old who underwent screening colonoscopy June 12, 2006.  At that time he was having minor intermittent rectal bleeding  due to hemorrhoids.  Colonoscopy revealed the presence of a solitary  ulcer in the region of the ileocecal valve felt likely due to non-  steroidal anti-inflammatory drugs (though no history of use by the  patient).  Biopsies were nonspecific.  The patient also had a 3 mm cecal  polyp, which was removed with snare cautery.  The patient did well until  yesterday when he developed some small volume bright red blood per  rectum.  This progressed last evening and, since that time, he has had  10 to 12 bloody bowel movements.  These were clots mainly.  He became  light-headed, but no other symptoms.  He saw his primary care physician,  Dr. Clent Ridges, who arranged for the patient to be admitted to [facility] hospital.  He has been admitted to [facility] hospital with a hemodynamically stable  lower GI bleed.  It is suspected that the patient has a post polypectomy  bleed.  Less likely is bleeding due to the aforementioned ulcer, though  no evidence of stigmata on his recent colonoscopy.     Wilhemina Bonito. Marina Goodell, MD  Electronically Signed    JNP/MedQ  DD: 06/24/2006  DT: 06/24/2006  Job #: [id number]   cc:   Vania Rea. Jarold Motto, MD, Caleen Essex, FAGA  Iva Boop, MD,FACG

## 2010-09-27 NOTE — Discharge Summary (Signed)
NAME:  URI, TURNBOUGH NO.:  [id number]   MEDICAL RECORD NO.:  [id number]          PATIENT TYPE:  INP   LOCATION:  5710                         FACILITY:  [facility]   PHYSICIAN:  Wilhemina Bonito. Marina Goodell, MD      DATE OF BIRTH:  July 12, 1945   DATE OF ADMISSION:  06/24/2006  DATE OF DISCHARGE:  06/26/2006                               DISCHARGE SUMMARY   ADMITTING DIAGNOSES:  1. Acute gastrointestinal bleed with bloody diarrhea.  Rule out      postpolypectomy bleed, rule out bleeding from ileal ulcer.  Rule      out hemorrhoidal bleed.  The patient had no mention of      diverticulosis present at recent colonoscopy, so this is less      likely a diverticular bleed.  2. Dizziness, probably volume contracted secondary to the      gastrointestinal bleed.  3. Anemia, possibly acute blood loss anemia.  4. Recent vasectomy last week.  5. Status post colonoscopy on February1, 2008.  A 3 mm sessile cecal      polyp was snared with cautery but not retrieved.  A solitary ulcer      in the ileum noted and biopsied showed active mucosal colitis with      focal erosion.  The pathology differential included nonsteroidal      anti-inflammatory drugs induced injury, ischemic injury, as well as      nonspecific changes.   DISCHARGE DIAGNOSES:  1. Lower gastrointestinal bleed likely postpolypectomy bleed.      Bleeding ceased following admission.  2. Anemia secondary to acute blood loss.  Did not require transfusion      during this admission.  Hemoglobin 9.7 at discharge.   CONSULTATIONS:  With GI, Dr. Marina Goodell.  The patient was transferred to Dr.  Lamar Sprinkles service after being admitted by Dr. Clent Ridges.   PROCEDURES:  None.   BRIEF HISTORY:  Mr. Roam is a pleasant 65 year old gentleman.  He  underwent screening colonoscopy on February1,2008.  The findings were  noted above.  He did have removal of a small cecal polyp and biopsy of  an ileal ulcer.  The patient did well following the procedure.  A  few  days later, he underwent outpatient vasectomy.  He was given some p.r.n.  narcotics for this.  He had some testicular pain following this but no  significant problems.  On February12, the patient noted a small amount  of blood with a morning bowel movement.  He was a bit concerned and did  contact Dr. Clent Ridges to be seen the following day.  About 9:00 that evening,  he proceeded to have multiple, he says about 12, bloody stools.  Ultimately, he was passing pure blood, some of which was clotted.  The  following morning, he had around 4 more bloody stools.  By that time, he  was feeling dizzy, lightheaded, but was not having any syncope or chest  pain.  He denied abdominal pain, nausea, and vomiting.  He was seen at  Dr. Claris Che office midday and sent over for admission to the hospital  and  GI evaluation.   LABORATORY:  Hemoglobin on admission 10.6, at discharge 9.7.  Hematocrit  went from 31.5 to 28.4.  MCV 88.3.  Platelets 223.  White blood cell  count 6.8.  PT 15.3, INR 1.2.  Sodium 135, potassium 4.1.  Chloride 102,  CO2 29.  Glucose 126.  BUN 21, creatinine 0.8.  LFTs within normal  limits.  Albumin 3.1.  Calcium 8.1.  Total protein 5.2.   HOSPITAL COURSE:  1. Lower gastrointestinal bleed.  Once the patient got to the      hospital, he had no further gross bleeding.  On hospital day #2,      February14, he had not had any bowel movements nor passed any      blood.  By hospital day #3, he had had a small, dark bowel      movement.  2. Acute blood loss anemia.  The patient was followed with serial      assays of hemoglobin and hematocrit.  These showed a moderate      decline in his hemoglobin from 10.6 down to 9.7.  He did not      complain of any dizziness once he received IV fluid hydration.  He      was not transfused with any packed red blood cells.  3. Orthostatic symptoms associated with gastrointestinal blood loss      volume contraction.  These resolved with IV fluids.  4.  Status post recent vasectomy.  On exam, the vasectomy scars were      healing nicely and showed no signs of infection.   PLAN:  The patient will be discharged today, February15, in stable and  improved condition.  He is to follow a low-residue diet for the next  week or so.  He should restrict himself from vigorous activity for 1  week, also avoid aspirin and nonsteroidal anti-inflammatory drugs for  the next couple of weeks and avoid vitamin E for the next 10 days.  He  should call Dr. Norval Gable office if he has recurrent bleeding or any  questions or concerns.  He does not need a scheduled followup with Dr.  Jarold Motto but, again, should call the office for any concerns or to make  an appointment as needed. He understood.   MEDICATIONS:  1. Glucosamine chondroitin 1 p.o. t.i.d.  2. Vitamin B complex 1 p.o. daily.  3. Calcium 1 p.o. daily.  4. Vitamin E 1 p.o. daily to restart in 10 days.  5. Multivitamin 1 p.o. daily.  6. Vicodin p.r.n.  7. Darvocet p.r.n.      Jennye Moccasin, PA-C      Wilhemina Bonito. Marina Goodell, MD  Electronically Signed    SG/MEDQ  D:  06/26/2006  T:  06/26/2006  Job:  [id number]   cc:   Jeannett Senior A. Clent Ridges, MD  Vania Rea. Jarold Motto, MD, Caleen Essex, FAGA

## 2011-02-27 ENCOUNTER — Telehealth: Payer: Self-pay | Admitting: Family Medicine

## 2011-02-27 ENCOUNTER — Ambulatory Visit (INDEPENDENT_AMBULATORY_CARE_PROVIDER_SITE_OTHER)

## 2011-02-27 DIAGNOSIS — Z23 Encounter for immunization: Secondary | ICD-10-CM

## 2011-02-27 NOTE — Telephone Encounter (Signed)
Pt would like to have cpx before 03-18-2011. Can I create 30 min slot?

## 2011-02-28 NOTE — Telephone Encounter (Signed)
That is okay.

## 2011-02-28 NOTE — Telephone Encounter (Signed)
Pt will be have cpx on 03-13-2011

## 2011-03-06 ENCOUNTER — Other Ambulatory Visit: Payer: Self-pay | Admitting: Family Medicine

## 2011-03-07 NOTE — Telephone Encounter (Signed)
Script sent e-scribe 

## 2011-03-12 ENCOUNTER — Encounter: Payer: Self-pay | Admitting: Family Medicine

## 2011-03-13 ENCOUNTER — Ambulatory Visit (INDEPENDENT_AMBULATORY_CARE_PROVIDER_SITE_OTHER): Payer: TRICARE For Life (TFL) | Admitting: Family Medicine

## 2011-03-13 ENCOUNTER — Encounter: Payer: Self-pay | Admitting: Family Medicine

## 2011-03-13 VITALS — BP 118/78 | HR 60 | Temp 98.5°F | Ht 70.25 in | Wt 184.0 lb

## 2011-03-13 DIAGNOSIS — N138 Other obstructive and reflux uropathy: Secondary | ICD-10-CM

## 2011-03-13 DIAGNOSIS — R030 Elevated blood-pressure reading, without diagnosis of hypertension: Secondary | ICD-10-CM

## 2011-03-13 DIAGNOSIS — J309 Allergic rhinitis, unspecified: Secondary | ICD-10-CM

## 2011-03-13 DIAGNOSIS — Z9109 Other allergy status, other than to drugs and biological substances: Secondary | ICD-10-CM

## 2011-03-13 DIAGNOSIS — N139 Obstructive and reflux uropathy, unspecified: Secondary | ICD-10-CM

## 2011-03-13 DIAGNOSIS — Z136 Encounter for screening for cardiovascular disorders: Secondary | ICD-10-CM

## 2011-03-13 DIAGNOSIS — N401 Enlarged prostate with lower urinary tract symptoms: Secondary | ICD-10-CM

## 2011-03-13 LAB — POCT URINALYSIS DIPSTICK
Leukocytes, UA: NEGATIVE
Protein, UA: NEGATIVE
Spec Grav, UA: 1.015
Urobilinogen, UA: 0.2
pH, UA: 6

## 2011-03-13 LAB — CBC WITH DIFFERENTIAL/PLATELET
Basophils Absolute: 0 10*3/uL (ref 0.0–0.1)
Eosinophils Absolute: 0.2 10*3/uL (ref 0.0–0.7)
Hemoglobin: 14.7 g/dL (ref 13.0–17.0)
Lymphocytes Relative: 25.3 % (ref 12.0–46.0)
MCHC: 33.8 g/dL (ref 30.0–36.0)
Monocytes Relative: 9 % (ref 3.0–12.0)
Neutro Abs: 2.8 10*3/uL (ref 1.4–7.7)
Neutrophils Relative %: 61.8 % (ref 43.0–77.0)
RBC: 4.85 Mil/uL (ref 4.22–5.81)
RDW: 13.9 % (ref 11.5–14.6)

## 2011-03-13 LAB — TSH: TSH: 2.1 u[IU]/mL (ref 0.35–5.50)

## 2011-03-13 LAB — BASIC METABOLIC PANEL
CO2: 33 mEq/L — ABNORMAL HIGH (ref 19–32)
Calcium: 9.2 mg/dL (ref 8.4–10.5)
Chloride: 101 mEq/L (ref 96–112)
Creatinine, Ser: 0.8 mg/dL (ref 0.4–1.5)
Glucose, Bld: 95 mg/dL (ref 70–99)

## 2011-03-13 LAB — HEPATIC FUNCTION PANEL
ALT: 28 U/L (ref 0–53)
AST: 24 U/L (ref 0–37)
Total Protein: 6.8 g/dL (ref 6.0–8.3)

## 2011-03-13 LAB — LIPID PANEL
Cholesterol: 189 mg/dL (ref 0–200)
HDL: 42.5 mg/dL (ref >39.00)
Triglycerides: 85 mg/dL (ref 0.0–149.0)

## 2011-03-13 MED ORDER — FLUTICASONE PROPIONATE 50 MCG/ACT NA SUSP: 2.0000 | Freq: Every day | NASAL | Status: DC

## 2011-03-13 NOTE — Progress Notes (Signed)
  Subjective:    Patient ID: Louis Ortega, male    DOB: 12-06-1945, 65 y.o.   MRN: [id number]  HPI 65 yr old male for a cpx. He feels well with no concerns. He continues to be active even though he is now retired. 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 He has even started taking piano lessons.    Review of Systems  Constitutional: Negative.   HENT: Negative.   Eyes: Negative.   Respiratory: Negative.   Cardiovascular: Negative.   Gastrointestinal: Negative.   Genitourinary: Negative.   Musculoskeletal: Negative.   Skin: Negative.   Neurological: Negative.   Hematological: Negative.   Psychiatric/Behavioral: Negative.        Objective:   Physical Exam  Constitutional: He is oriented to person, place, and time. He appears well-developed and well-nourished. No distress.  HENT:  Head: Normocephalic and atraumatic.  Right Ear: External ear normal.  Left Ear: External ear normal.  Nose: Nose normal.  Mouth/Throat: Oropharynx is clear and moist. No oropharyngeal exudate.  Eyes: Conjunctivae and EOM are normal. Pupils are equal, round, and reactive to light. Right eye exhibits no discharge. Left eye exhibits no discharge. No scleral icterus.  Neck: Neck supple. No JVD present. No tracheal deviation present. No thyromegaly present.  Cardiovascular: Normal rate, regular rhythm, normal heart sounds and intact distal pulses.  Exam reveals no gallop and no friction rub.   No murmur heard.      EKG normal   Pulmonary/Chest: Effort normal and breath sounds normal. No respiratory distress. He has no wheezes. He has no rales. He exhibits no tenderness.  Abdominal: Soft. Bowel sounds are normal. He exhibits no distension and no mass. There is no tenderness. There is no rebound and no guarding.  Genitourinary: Rectum normal, prostate normal and penis normal. Guaiac negative stool. No penile tenderness.  Musculoskeletal: Normal range of motion. He exhibits no edema and no tenderness.    Lymphadenopathy:    He has no cervical adenopathy.  Neurological: He is alert and oriented to person, place, and time. He has normal reflexes. No cranial nerve deficit. He exhibits normal muscle tone. Coordination normal.  Skin: Skin is warm and dry. No rash noted. He is not diaphoretic. No erythema. No pallor.  Psychiatric: He has a normal mood and affect. His behavior is normal. Judgment and thought content normal.          Assessment & Plan:  Well exam. Get fasting labs.

## 2011-04-08 ENCOUNTER — Encounter: Admitting: Family Medicine

## 2011-07-16 ENCOUNTER — Encounter: Payer: Self-pay | Admitting: Gastroenterology

## 2011-08-21 ENCOUNTER — Telehealth: Payer: Self-pay | Admitting: Family Medicine

## 2011-08-21 DIAGNOSIS — Z79899 Other long term (current) drug therapy: Secondary | ICD-10-CM

## 2011-08-21 NOTE — Telephone Encounter (Signed)
Can you call pt and schedule the lab appointment? 

## 2011-08-21 NOTE — Telephone Encounter (Signed)
I put in the order. He can schedule a draw

## 2011-08-21 NOTE — Telephone Encounter (Signed)
Called pt and sch for 08/28/11 at 9am for lab as noted.

## 2011-08-21 NOTE — Telephone Encounter (Signed)
Pt called and went to Dermatolgist, Dr Terri Piedra, who put pt on Terbinafine. Pt needs to get a lft to test liver lvls. done and is req to get lab order for here at [facility].

## 2011-08-28 ENCOUNTER — Other Ambulatory Visit (INDEPENDENT_AMBULATORY_CARE_PROVIDER_SITE_OTHER): Payer: Medicare Other

## 2011-08-28 DIAGNOSIS — Z79899 Other long term (current) drug therapy: Secondary | ICD-10-CM

## 2011-08-28 DIAGNOSIS — Z5189 Encounter for other specified aftercare: Secondary | ICD-10-CM

## 2011-08-28 LAB — HEPATIC FUNCTION PANEL
AST: 27 U/L (ref 0–37)
Alkaline Phosphatase: 49 U/L (ref 39–117)
Bilirubin, Direct: 0.1 mg/dL (ref 0.0–0.3)

## 2011-09-02 NOTE — Progress Notes (Signed)
Quick Note:  Pt informed ______ 

## 2011-10-01 ENCOUNTER — Encounter: Payer: Self-pay | Admitting: Family Medicine

## 2011-10-01 ENCOUNTER — Ambulatory Visit (INDEPENDENT_AMBULATORY_CARE_PROVIDER_SITE_OTHER): Payer: Medicare Other | Admitting: Family Medicine

## 2011-10-01 VITALS — BP 126/88 | HR 62 | Temp 97.9°F | Wt 186.0 lb

## 2011-10-01 DIAGNOSIS — J40 Bronchitis, not specified as acute or chronic: Secondary | ICD-10-CM

## 2011-10-01 MED ORDER — AZITHROMYCIN 250 MG PO TABS: ORAL_TABLET | ORAL | Status: AC

## 2011-10-01 NOTE — Progress Notes (Signed)
  Subjective:    Patient ID: Louis Ortega, male    DOB: [birth year], 66 y.o.   MRN: [id number]  HPI Here for 2 weeks of PND, ST, watering of the right eye, hoarseness, and a dry cough. No fever.    Review of Systems  Constitutional: Negative.   HENT: Positive for congestion, sore throat, rhinorrhea, voice change and postnasal drip.   Eyes: Positive for discharge and itching. Negative for pain and redness.  Respiratory: Positive for cough.        Objective:   Physical Exam  Constitutional: He appears well-developed and well-nourished.  HENT:  Right Ear: External ear normal.  Left Ear: External ear normal.  Nose: Nose normal.  Mouth/Throat: Oropharynx is clear and moist. No oropharyngeal exudate.  Eyes: Conjunctivae are normal.  Neck: No thyromegaly present.  Pulmonary/Chest: Breath sounds normal. No respiratory distress. He has no wheezes. He has no rales.  Lymphadenopathy:    He has no cervical adenopathy.          Assessment & Plan:  Atypical bronchitis. Treat with a Zpack.

## 2011-10-23 ENCOUNTER — Ambulatory Visit (INDEPENDENT_AMBULATORY_CARE_PROVIDER_SITE_OTHER)
Admission: RE | Admit: 2011-10-23 | Discharge: 2011-10-23 | Disposition: A | Discharge status: Home / Self Care | Payer: Medicare Other | Source: Ambulatory Visit | Attending: Family Medicine | Admitting: Family Medicine

## 2011-10-23 ENCOUNTER — Encounter: Payer: Self-pay | Admitting: Family Medicine

## 2011-10-23 ENCOUNTER — Ambulatory Visit (INDEPENDENT_AMBULATORY_CARE_PROVIDER_SITE_OTHER): Payer: Medicare Other | Admitting: Family Medicine

## 2011-10-23 VITALS — BP 124/80 | HR 63 | Temp 98.0°F | Wt 182.0 lb

## 2011-10-23 DIAGNOSIS — R05 Cough: Secondary | ICD-10-CM

## 2011-10-23 MED ORDER — RABEPRAZOLE SODIUM 20 MG PO TBEC: 20.0000 mg | DELAYED_RELEASE_TABLET | Freq: Every day | ORAL | Status: DC

## 2011-10-23 NOTE — Progress Notes (Signed)
  Subjective:    Patient ID: Louis Ortega, male    DOB: Apr 04, 1946, 66 y.o.   MRN: [id number]  HPI Here for several months of a dry tickling cough that comes and goes. No fever. He was seen here one month ago and tried a Zpack for this, but this had no effect. No allergy symptoms like sinus pressure or PND. No heartburn or indigestion. He notes that he is a former smoker who quit 20 years ago.    Review of Systems  Constitutional: Negative.   HENT: Negative.   Eyes: Negative.   Respiratory: Negative for chest tightness, shortness of breath and wheezing.   Cardiovascular: Negative.   Gastrointestinal: Negative.        Objective:   Physical Exam  Constitutional: He appears well-developed and well-nourished.  HENT:  Right Ear: External ear normal.  Left Ear: External ear normal.  Nose: Nose normal.  Mouth/Throat: Oropharynx is clear and moist.  Eyes: Conjunctivae are normal.  Neck: No thyromegaly present.  Cardiovascular: Normal rate, regular rhythm, normal heart sounds and intact distal pulses.   Pulmonary/Chest: Effort normal and breath sounds normal. No respiratory distress. He has no wheezes. He has no rales.  Lymphadenopathy:    He has no cervical adenopathy.          Assessment & Plan:  Chronic cough, probably either due to silent GERD or allergies. However due to his smoking hx, we will send him for a CXR today. Try samples of Aciphex for 2 weeks.

## 2011-10-24 NOTE — Progress Notes (Signed)
Quick Note:  I left voice message with normal results. ______ 

## 2011-10-30 ENCOUNTER — Encounter: Payer: Self-pay | Admitting: Gastroenterology

## 2011-12-02 ENCOUNTER — Ambulatory Visit (AMBULATORY_SURGERY_CENTER): Payer: Medicare Other | Admitting: *Deleted

## 2011-12-02 VITALS — Ht 71.5 in | Wt 181.6 lb

## 2011-12-02 DIAGNOSIS — Z1211 Encounter for screening for malignant neoplasm of colon: Secondary | ICD-10-CM

## 2011-12-02 MED ORDER — MOVIPREP 100 G PO SOLR: ORAL | Status: DC

## 2011-12-16 ENCOUNTER — Encounter: Payer: Self-pay | Admitting: Gastroenterology

## 2011-12-16 ENCOUNTER — Ambulatory Visit (AMBULATORY_SURGERY_CENTER): Payer: Medicare Other | Admitting: Gastroenterology

## 2011-12-16 VITALS — BP 159/85 | HR 55 | Temp 97.0°F | Resp 23 | Ht 71.5 in | Wt 181.0 lb

## 2011-12-16 DIAGNOSIS — Z86010 Personal history of colonic polyps: Secondary | ICD-10-CM

## 2011-12-16 DIAGNOSIS — Z1211 Encounter for screening for malignant neoplasm of colon: Secondary | ICD-10-CM

## 2011-12-16 HISTORY — PX: COLONOSCOPY: SHX174

## 2011-12-16 MED ORDER — SODIUM CHLORIDE 0.9 % IV SOLN: 500.0000 mL | INTRAVENOUS | Status: DC

## 2011-12-16 NOTE — Patient Instructions (Addendum)
YOU HAD AN ENDOSCOPIC PROCEDURE TODAY AT THE [facility]: Refer to the procedure report that was given to you for any specific questions about what was found during the examination.  If the procedure report does not answer your questions, please call your gastroenterologist to clarify.  If you requested that your care partner not be given the details of your procedure findings, then the procedure report has been included in a sealed envelope for you to review at your convenience later.  YOU SHOULD EXPECT: Some feelings of bloating in the abdomen. Passage of more gas than usual.  Walking can help get rid of the air that was put into your GI tract during the procedure and reduce the bloating. If you had a lower endoscopy (such as a colonoscopy or flexible sigmoidoscopy) you may notice spotting of blood in your stool or on the toilet paper. If you underwent a bowel prep for your procedure, then you may not have a normal bowel movement for a few days.  DIET: Your first meal following the procedure should be a light meal and then it is ok to progress to your normal diet.  A half-sandwich or bowl of soup is an example of a good first meal.  Heavy or fried foods are harder to digest and may make you feel nauseous or bloated.  Likewise meals heavy in dairy and vegetables can cause extra gas to form and this can also increase the bloating.  Drink plenty of fluids but you should avoid alcoholic beverages for 24 hours.  ACTIVITY: Your care partner should take you home directly after the procedure.  You should plan to take it easy, moving slowly for the rest of the day.  You can resume normal activity the day after the procedure however you should NOT DRIVE or use heavy machinery for 24 hours (because of the sedation medicines used during the test).    SYMPTOMS TO REPORT IMMEDIATELY: A gastroenterologist can be reached at any hour.  During normal business hours, 8:30 AM to 5:00 PM Monday through Friday,  call [phone].  After hours and on weekends, please call the GI answering service at [phone] who will take a message and have the physician on call contact you.   Following lower endoscopy (colonoscopy or flexible sigmoidoscopy):  Excessive amounts of blood in the stool  Significant tenderness or worsening of abdominal pains  Swelling of the abdomen that is new, acute  Fever of 100F or higher    FOLLOW UP: If any biopsies were taken you will be contacted by phone or by letter within the next 1-3 weeks.  Call your gastroenterologist if you have not heard about the biopsies in 3 weeks.  Our staff will call the home number listed on your records the next business day following your procedure to check on you and address any questions or concerns that you may have at that time regarding the information given to you following your procedure. This is a courtesy call and so if there is no answer at the home number and we have not heard from you through the emergency physician on call, we will assume that you have returned to your regular daily activities without incident.  SIGNATURES/CONFIDENTIALITY: You and/or your care partner have signed paperwork which will be entered into your electronic medical record.  These signatures attest to the fact that that the information above on your After Visit Summary has been reviewed and is understood.  Full responsibility of the confidentiality   of this discharge information lies with you and/or your care-partner.     

## 2011-12-16 NOTE — Progress Notes (Signed)
Patient did not have preoperative order for IV antibiotic SSI prophylaxis. (G8918)  Patient did not experience any of the following events: a burn prior to discharge; a fall within the facility; wrong site/side/patient/procedure/implant event; or a hospital transfer or hospital admission upon discharge from the facility. (G8907)  

## 2011-12-16 NOTE — Op Note (Signed)
[facility] [address] [place], [place]  [address]  COLONOSCOPY PROCEDURE REPORT  PATIENT:  Louis, Ortega  MR#:  [id number] BIRTHDATE:  October 14, 1945, 65 yrs. old  GENDER:  male ENDOSCOPIST:  Vania Rea. Jarold Motto, MD, [facility] REF. BY: PROCEDURE DATE:  12/16/2011 PROCEDURE:  Diagnostic Colonoscopy ASA CLASS:  Class II INDICATIONS:  history of polyps MEDICATIONS:   propofol (Diprivan) 200 mg IV  DESCRIPTION OF PROCEDURE:   After the risks and benefits and of the procedure were explained, informed consent was obtained. Digital rectal exam was performed and revealed no abnormalities. The LB CF-[id number] endoscope was introduced through the anus and advanced to the cecum, which was identified by both the appendix and ileocecal valve.  The quality of the prep was excellent, using MoviPrep.  The instrument was then slowly withdrawn as the colon was fully examined. <<PROCEDUREIMAGES>>  FINDINGS:  No polyps or cancers were seen.  This was otherwise a normal examination of the colon.   Retroflexed views in the rectum revealed no abnormalities.    The scope was then withdrawn from the patient and the procedure completed.  COMPLICATIONS:  None ENDOSCOPIC IMPRESSION: 1) No polyps or cancers 2) Otherwise normal examination RECOMMENDATIONS: 1) Continue current colorectal screening recommendations for "routine risk" patients with a repeat colonoscopy in 10 years.  REPEAT EXAM:  No  ______________________________ Vania Rea. Jarold Motto, MD, [facility]  CC:  Nelwyn Salisbury, MD  n. Rosalie DoctorMarland Kitchen   Vania Rea. Janos Shampine at 12/16/2011 08:53 AM  Laymond Purser, [phone]

## 2011-12-17 ENCOUNTER — Telehealth: Payer: Self-pay

## 2011-12-17 NOTE — Telephone Encounter (Signed)
  Follow up Call-  Call back number 12/16/2011  Post procedure Call Back phone  # [phone], [phone]  Permission to leave phone message Yes     Patient questions:  Do you have a fever, pain , or abdominal swelling? no Pain Score  0 *  Have you tolerated food without any problems? yes  Have you been able to return to your normal activities? yes  Do you have any questions about your discharge instructions: Diet   no Medications  no Follow up visit  no  Do you have questions or concerns about your Care? no  Actions: * If pain score is 4 or above: No action needed, pain <4.

## 2012-03-08 ENCOUNTER — Ambulatory Visit (INDEPENDENT_AMBULATORY_CARE_PROVIDER_SITE_OTHER): Payer: Medicare Other | Admitting: Family Medicine

## 2012-03-08 ENCOUNTER — Encounter: Payer: Self-pay | Admitting: Family Medicine

## 2012-03-08 VITALS — BP 114/78 | HR 56 | Temp 97.6°F | Ht 70.5 in | Wt 176.0 lb

## 2012-03-08 DIAGNOSIS — N401 Enlarged prostate with lower urinary tract symptoms: Secondary | ICD-10-CM

## 2012-03-08 DIAGNOSIS — N139 Obstructive and reflux uropathy, unspecified: Secondary | ICD-10-CM

## 2012-03-08 DIAGNOSIS — G4733 Obstructive sleep apnea (adult) (pediatric): Secondary | ICD-10-CM

## 2012-03-08 DIAGNOSIS — G250 Essential tremor: Secondary | ICD-10-CM

## 2012-03-08 DIAGNOSIS — E785 Hyperlipidemia, unspecified: Secondary | ICD-10-CM

## 2012-03-08 DIAGNOSIS — Z87442 Personal history of urinary calculi: Secondary | ICD-10-CM

## 2012-03-08 DIAGNOSIS — G252 Other specified forms of tremor: Secondary | ICD-10-CM

## 2012-03-08 DIAGNOSIS — M069 Rheumatoid arthritis, unspecified: Secondary | ICD-10-CM

## 2012-03-08 DIAGNOSIS — Z86010 Personal history of colonic polyps: Secondary | ICD-10-CM

## 2012-03-08 DIAGNOSIS — Z23 Encounter for immunization: Secondary | ICD-10-CM

## 2012-03-08 LAB — CBC WITH DIFFERENTIAL/PLATELET
Basophils Relative: 0.4 % (ref 0.0–3.0)
Eosinophils Absolute: 0.2 10*3/uL (ref 0.0–0.7)
Eosinophils Relative: 3.5 % (ref 0.0–5.0)
Hemoglobin: 14 g/dL (ref 13.0–17.0)
Lymphocytes Relative: 31.4 % (ref 12.0–46.0)
MCHC: 32.5 g/dL (ref 30.0–36.0)
Monocytes Relative: 9 % (ref 3.0–12.0)
Neutro Abs: 2.6 10*3/uL (ref 1.4–7.7)
Neutrophils Relative %: 55.7 % (ref 43.0–77.0)
RBC: 4.75 Mil/uL (ref 4.22–5.81)
WBC: 4.6 10*3/uL (ref 4.5–10.5)

## 2012-03-08 LAB — POCT URINALYSIS DIPSTICK
Bilirubin, UA: NEGATIVE
Glucose, UA: NEGATIVE
Ketones, UA: NEGATIVE
Leukocytes, UA: NEGATIVE
Nitrite, UA: NEGATIVE
pH, UA: 7

## 2012-03-08 LAB — BASIC METABOLIC PANEL
Chloride: 100 mEq/L (ref 96–112)
GFR: 86.41 mL/min (ref >60.00)
Potassium: 4.3 mEq/L (ref 3.5–5.1)

## 2012-03-08 LAB — PSA: PSA: 0.75 ng/mL (ref 0.10–4.00)

## 2012-03-08 LAB — HEPATIC FUNCTION PANEL
AST: 18 U/L (ref 0–37)
Alkaline Phosphatase: 43 U/L (ref 39–117)
Total Bilirubin: 0.5 mg/dL (ref 0.3–1.2)

## 2012-03-08 LAB — LIPID PANEL
LDL Cholesterol: 128 mg/dL — ABNORMAL HIGH (ref 0–99)
Total CHOL/HDL Ratio: 4
VLDL: 11 mg/dL (ref 0.0–40.0)

## 2012-03-08 NOTE — Progress Notes (Signed)
  Subjective:    Patient ID: Louis Ortega, male    DOB: [birth year], 65 y.o.   MRN: [id number]  HPI 66 yr old male for a cpx. He feels fine and has no concerns. This is his last visit with [place] since he will be moving to [place], [place] in a few weeks to join his wife.    Review of Systems  Constitutional: Negative.   HENT: Negative.   Eyes: Negative.   Respiratory: Negative.   Cardiovascular: Negative.   Gastrointestinal: Negative.   Genitourinary: Negative.   Musculoskeletal: Negative.   Skin: Negative.   Neurological: Negative.   Hematological: Negative.   Psychiatric/Behavioral: Negative.        Objective:   Physical Exam  Constitutional: He is oriented to person, place, and time. He appears well-developed and well-nourished. No distress.  HENT:  Head: Normocephalic and atraumatic.  Right Ear: External ear normal.  Left Ear: External ear normal.  Nose: Nose normal.  Mouth/Throat: Oropharynx is clear and moist. No oropharyngeal exudate.  Eyes: Conjunctivae normal and EOM are normal. Pupils are equal, round, and reactive to light. Right eye exhibits no discharge. Left eye exhibits no discharge. No scleral icterus.  Neck: Neck supple. No JVD present. No tracheal deviation present. No thyromegaly present.  Cardiovascular: Normal rate, regular rhythm, normal heart sounds and intact distal pulses.  Exam reveals no gallop and no friction rub.   No murmur heard.      EKG normal   Pulmonary/Chest: Effort normal and breath sounds normal. No respiratory distress. He has no wheezes. He has no rales. He exhibits no tenderness.  Abdominal: Soft. Bowel sounds are normal. He exhibits no distension and no mass. There is no tenderness. There is no rebound and no guarding.  Genitourinary: Rectum normal, prostate normal and penis normal. Guaiac negative stool. No penile tenderness.  Musculoskeletal: Normal range of motion. He exhibits no edema and no tenderness.  Lymphadenopathy:    He has no  cervical adenopathy.  Neurological: He is alert and oriented to person, place, and time. He has normal reflexes. No cranial nerve deficit. He exhibits normal muscle tone. Coordination normal.  Skin: Skin is warm and dry. No rash noted. He is not diaphoretic. No erythema. No pallor.  Psychiatric: He has a normal mood and affect. His behavior is normal. Judgment and thought content normal.          Assessment & Plan:  Well exam.

## 2012-03-08 NOTE — Addendum Note (Signed)
Addended by: Aniceto Boss A on: 03/08/2012 03:47 PM   Modules accepted: Orders

## 2012-03-10 NOTE — Progress Notes (Signed)
Quick Note:  I spoke with pt and he now thinks he might need a stress test. Is this something that you would recommend? ______

## 2012-03-15 ENCOUNTER — Encounter: Payer: Self-pay | Admitting: Family Medicine

## 2012-04-15 ENCOUNTER — Encounter: Payer: Medicare Other | Admitting: Family Medicine

## 2012-04-27 ENCOUNTER — Telehealth: Payer: Self-pay | Admitting: Family Medicine

## 2012-04-27 NOTE — Telephone Encounter (Signed)
Patient Information:  Caller Name: Louis Ortega  Phone: [phone]  Patient: Louis Ortega, Louis Ortega  Gender: Male  DOB: 01-Feb-1946  Age: 66 Years  PCP: [facility])  Office Follow Up:  Does the office need to follow up with this patient?: No  Instructions For The Office: N/A  RN Note:  Right knee pain continues despite exercises given by Orthopedics.  Onset 2 weeks ago he had acute exacerbation of pain.  States he now is having trouble sleeping due to knee pain and leg muscle spasms.  Per protocol,  emergent symptoms denied; advised appt within 72 hours.  Appt scheduled 04/29/12 0930 with Dr. Clent Ridges.  krs/can  Symptoms  Reason For Call & Symptoms: right knee pain; seen by Dr. Thurston Hole recently and CT was normal.  Muscle spasms occur while sitting, not while walking.  Given exercises but pain has not improved.  Reviewed Health History In EMR: Yes  Reviewed Medications In EMR: Yes  Reviewed Allergies In EMR: Yes  Reviewed Surgeries / Procedures: Yes  Date of Onset of Symptoms: Unknown  Guideline(s) Used:  Knee Pain  Disposition Per Guideline:   See Within 3 Days in Office  Reason For Disposition Reached:   Moderate pain (e.g., symptoms interfere with work or school, limping) and present > 3 days  Advice Given:  N/A  Appointment Scheduled:  04/29/2012 09:30:00 Appointment Scheduled Provider:  G[facility]hon [facility])

## 2012-04-29 ENCOUNTER — Ambulatory Visit (INDEPENDENT_AMBULATORY_CARE_PROVIDER_SITE_OTHER): Payer: Medicare Other | Admitting: Family Medicine

## 2012-04-29 ENCOUNTER — Encounter: Payer: Self-pay | Admitting: Family Medicine

## 2012-04-29 VITALS — BP 120/80 | HR 60 | Temp 98.1°F | Wt 177.0 lb

## 2012-04-29 DIAGNOSIS — M6580 Other synovitis and tenosynovitis, unspecified site: Secondary | ICD-10-CM

## 2012-04-29 DIAGNOSIS — M76891 Other specified enthesopathies of right lower limb, excluding foot: Secondary | ICD-10-CM

## 2012-04-29 MED ORDER — PREDNISONE 10 MG PO TABS: ORAL_TABLET | ORAL | Status: DC

## 2012-04-29 NOTE — Progress Notes (Signed)
  Subjective:    Patient ID: Louis Ortega, male    DOB: 1945/05/14, 66 y.o.   MRN: [id number]  HPI Here for intermittent pain in the right knee. The worst area is just above the patella. No trauma hx but he is very active with yardwork and landscaping, and he does a lot of digging, shoveling, stooping, crawling, etc. He also runs on the treadmill daily and does some lower body weight work and lunges. He has tried Meloxicam, Voltaren gel, and Aleve with mixed results. No swelling.    Review of Systems  Constitutional: Negative.   Musculoskeletal: Positive for arthralgias.       Objective:   Physical Exam  Constitutional: He appears well-developed and well-nourished.  Musculoskeletal:       The right knee is tender at the insertion of the quadriceps tendon onto the patella. No swelling. Full ROM.           Assessment & Plan:  This is overuse tendonitis. Advised him to stop the lunges and the lower body weight work. Take a break from the landscaping. Given a 5 week taper of prednisone starting at 50 mg a day. Ice the area. Recheck prn

## 2012-05-14 ENCOUNTER — Other Ambulatory Visit: Payer: Self-pay | Admitting: Family Medicine

## 2012-05-20 ENCOUNTER — Ambulatory Visit (INDEPENDENT_AMBULATORY_CARE_PROVIDER_SITE_OTHER): Payer: Medicare Other | Admitting: Family Medicine

## 2012-05-20 ENCOUNTER — Encounter: Payer: Self-pay | Admitting: Family Medicine

## 2012-05-20 VITALS — BP 130/80 | Temp 98.2°F | Wt 181.0 lb

## 2012-05-20 DIAGNOSIS — J069 Acute upper respiratory infection, unspecified: Secondary | ICD-10-CM

## 2012-05-20 MED ORDER — GUAIFENESIN-CODEINE 100-10 MG/5ML PO SYRP: 5.0000 mL | ORAL_SOLUTION | Freq: Three times a day (TID) | ORAL | Status: DC | PRN

## 2012-05-20 NOTE — Progress Notes (Signed)
Chief Complaint  Patient presents with  . Cough    congestion, sweating started at the beginning of Jan     HPI: -started: about 7 days ago -symptoms:nasal congestion, cough, hoarseness, yellow sputum, feels feverish at night - sweating -denies:fever, SOB, NVD, tooth pain, body aches -sick contacts: recently on cruise -Hx of: denies hx of COPD or asthma  ROS: See pertinent positives and negatives per HPI.  Past Medical History  Diagnosis Date  . Allergic rhinitis   . Hyperlipidemia   . Benign prostatic hypertrophy   . Arthritis, degenerative   . Hx of colonic polyps   . Nephrolithiasis   . Spontaneous pneumothorax 1985  . Sleep apnea     sees Dr. Marcelyn Bruins   . Tremor, essential     right arm; sees Dr. Lesia Sago     Family History  Problem Relation Age of Onset  . Coronary artery disease Other   . Osteoarthritis Other   . Rheum arthritis Other   . Alcohol abuse Other   . Cancer Other     mesenteric  . Hypertension Other   . Colon cancer Neg Hx     History   Social History  . Marital Status: Married    Spouse Name: N/A    Number of Children: N/A  . Years of Education: N/A   Social History Main Topics  . Smoking status: Never Smoker   . Smokeless tobacco: Never Used  . Alcohol Use: 1.5 oz/week    3 drink(s) per week  . Drug Use: No  . Sexually Active: None   Other Topics Concern  . None   Social History Narrative  . None    Current outpatient prescriptions:CREATINE PO, Take by mouth as needed., Disp: , Rfl: ;  glucosamine-chondroitin 500-400 MG tablet, Take 1 tablet by mouth 2 (two) times daily.  , Disp: , Rfl: ;  Multiple Vitamin (MULTIVITAMIN) tablet, Take 1 tablet by mouth daily.  , Disp: , Rfl: ;  naproxen sodium (ANAPROX) 220 MG tablet, Take 220 mg by mouth as needed., Disp: , Rfl:  fluticasone (FLONASE) 50 MCG/ACT nasal spray, USE 2 SPRAYS NASALLY DAILY, Disp: 16 g, Rfl: 6;  guaiFENesin-codeine (ROBITUSSIN AC) 100-10 MG/5ML syrup, Take 5 mLs  by mouth 3 (three) times daily as needed for cough., Disp: 120 mL, Rfl: 0;  predniSONE (DELTASONE) 10 MG tablet, Take as directed, Disp: 100 tablet, Rfl: 0  EXAM:  Filed Vitals:   05/20/12 1546  BP: 130/80  Temp: 98.2 F (36.8 C)    There is no height on file to calculate BMI.  GENERAL: vitals reviewed and listed above, alert, oriented, appears well hydrated and in no acute distress  HEENT: atraumatic, conjunttiva clear, no obvious abnormalities on inspection of external nose and ears, normal appearance of ear canals and TMs, clear nasal congestion, mild post oropharyngeal erythema with PND, no tonsillar edema or exudate, no sinus TTP  NECK: no obvious masses on inspection  LUNGS: clear to auscultation bilaterally, few scattered exp wheezes, no rales or rhonchi, good air movement  CV: HRRR, no peripheral edema  MS: moves all extremities without noticeable abnormality  PSYCH: pleasant and cooperative, no obvious depression or anxiety  ASSESSMENT AND PLAN:  Discussed the following assessment and plan:  1. Upper respiratory infection  guaiFENesin-codeine (ROBITUSSIN AC) 100-10 MG/5ML syrup   -likely viral, discussed risks/benefits abx and pt prefers to hold off on this - cough med provided (risks discussed), supportive care and return precautions discussed -Patient  advised to return or notify a doctor immediately if symptoms worsen or persist or new concerns arise.  Patient Instructions  INSTRUCTIONS FOR UPPER RESPIRATORY INFECTION:  -plenty of rest and fluids  -nasal saline wash 2-3 times daily (use prepackaged nasal saline or bottled/distilled water if making your own)   -can use sinex nasal spray for drainage and nasal congestion - but do NOT use longer then 3-4 days  -can use tylenol or ibuprofen as directed for aches and sorethroat  -in the winter time, using a humidifier at night is helpful (please follow cleaning instructions)  -if you are taking a cough  medication - use only as directed, may also try a teaspoon of honey to coat the throat and throat lozenges  -for sore throat, salt water gargles can help  -follow up if you have fevers, facial pain, tooth pain, difficulty breathing or are worsening or not getting better in 5-7 days      KIM, HANNAH R.

## 2012-05-20 NOTE — Patient Instructions (Signed)

## 2012-05-21 ENCOUNTER — Ambulatory Visit: Payer: Medicare Other | Admitting: Family Medicine

## 2012-05-26 ENCOUNTER — Ambulatory Visit (INDEPENDENT_AMBULATORY_CARE_PROVIDER_SITE_OTHER)
Admission: RE | Admit: 2012-05-26 | Discharge: 2012-05-26 | Disposition: A | Discharge status: Home / Self Care | Payer: Medicare Other | Source: Ambulatory Visit | Attending: Family Medicine | Admitting: Family Medicine

## 2012-05-26 ENCOUNTER — Ambulatory Visit (INDEPENDENT_AMBULATORY_CARE_PROVIDER_SITE_OTHER): Payer: Medicare Other | Admitting: Family Medicine

## 2012-05-26 ENCOUNTER — Encounter: Payer: Self-pay | Admitting: Family Medicine

## 2012-05-26 VITALS — BP 130/82 | HR 74 | Temp 97.9°F | Wt 172.0 lb

## 2012-05-26 DIAGNOSIS — R05 Cough: Secondary | ICD-10-CM

## 2012-05-26 DIAGNOSIS — K649 Unspecified hemorrhoids: Secondary | ICD-10-CM

## 2012-05-26 DIAGNOSIS — J189 Pneumonia, unspecified organism: Secondary | ICD-10-CM

## 2012-05-26 MED ORDER — AMOXICILLIN-POT CLAVULANATE 875-125 MG PO TABS: 1.0000 | ORAL_TABLET | Freq: Two times a day (BID) | ORAL | Status: DC

## 2012-05-26 MED ORDER — HYDROCORTISONE 2.5 % RE CREA: TOPICAL_CREAM | Freq: Two times a day (BID) | RECTAL | Status: AC

## 2012-05-26 NOTE — Progress Notes (Signed)
  Subjective:    Patient ID: Louis Ortega, male    DOB: [birth year], 67 y.o.   MRN: [id number]  HPI Here to follow up on cough and fever which started 2 weeks ago. He had fevers as high as 102.7 degrees at first but these have subsided. The cough is mostly dry but may produce yellow sputum at times. He was seen her 6 days ago and this was felt to be viral. hoever the symptoms have persisted. Using Mucinex. No SOB or chest pain. Also during this time his hemorrhoids have flared up and are not responding to Preparation H as usual.    Review of Systems  Constitutional: Positive for fever and chills.  HENT: Negative.   Eyes: Negative.   Respiratory: Positive for cough and chest tightness.   Cardiovascular: Negative.        Objective:   Physical Exam  Constitutional: He appears well-developed and well-nourished. No distress.  HENT:  Right Ear: External ear normal.  Left Ear: External ear normal.  Nose: Nose normal.  Mouth/Throat: Oropharynx is clear and moist.  Eyes: Conjunctivae normal are normal.  Pulmonary/Chest: Effort normal. No respiratory distress. He has no wheezes.       Rales are present at both bases   Lymphadenopathy:    He has no cervical adenopathy.          Assessment & Plan:  Probable pneumonia. Will send him for a CXR today. Start on Augmentin for the pneumonia and on Anusol [facility] for the hemorrhoids. Drink fluids.

## 2012-05-27 ENCOUNTER — Telehealth: Payer: Self-pay | Admitting: Family Medicine

## 2012-05-27 NOTE — Telephone Encounter (Signed)
I spoke with pt  

## 2012-05-27 NOTE — Telephone Encounter (Signed)
Patient Information:  Caller Name: Tyreck  Phone: [phone]  Patient: Louis Ortega, Louis Ortega  Gender: Male  DOB: [birth year]  Age: 67 Years  PCP: [facility])  Office Follow Up:  Does the office need to follow up with this patient?: Yes  Instructions For The Office: Please call pt back after talking with MD regarding is pt contagious.  Triager reviewed health education with pt regarding pneumonia but pt still requesting information from MD regarding is pneumonia contagious to his wife.  PLEASE CALL PT BACK AT [phone] TO ADVISE. Thanks.  RN Note:  Economist reviewed health education with pt per Adult Health Advisor 2012 Pneumonia Adult. Pt still concerned and would like call back from MD.  York Spaniel wants to make sure from MD that he is not contagious before his wife comes up from [place] to see him.  Symptoms  Reason For Call & Symptoms: Wants to know if pneumonia is contagious.  Was seen in office yesterday by Dr. Clent Ridges and diagnosed with PNE and wife is concerned that he may be contagious.  Reviewed Health History In EMR: N/A  Reviewed Medications In EMR: N/A  Reviewed Allergies In EMR: N/A  Reviewed Surgeries / Procedures: N/A  Date of Onset of Symptoms: 05/27/2012  Guideline(s) Used:  No Protocol Available - Information Only  Disposition Per Guideline:   Discuss with PCP and Callback by Nurse Today  Reason For Disposition Reached:   Nursing judgment  Advice Given:  N/A  Patient Refused Recommendation:  Patient Refused Care Advice  would like call back from MD.

## 2012-05-27 NOTE — Progress Notes (Signed)
Quick Note:  I spoke with pt ______ 

## 2012-06-23 ENCOUNTER — Encounter: Payer: Self-pay | Admitting: Family Medicine

## 2012-06-23 ENCOUNTER — Ambulatory Visit (INDEPENDENT_AMBULATORY_CARE_PROVIDER_SITE_OTHER): Payer: Medicare Other | Admitting: Family Medicine

## 2012-06-23 VITALS — BP 126/78 | HR 72 | Temp 98.0°F | Wt 178.0 lb

## 2012-06-23 DIAGNOSIS — M6740 Ganglion, unspecified site: Secondary | ICD-10-CM

## 2012-06-23 DIAGNOSIS — M25559 Pain in unspecified hip: Secondary | ICD-10-CM

## 2012-06-23 NOTE — Progress Notes (Signed)
  Subjective:    Patient ID: Louis Ortega, male    DOB: Dec 03, 1945, 67 y.o.   MRN: [id number]  HPI Here to describe a mildly painful lump which appeared on the right index finger about one month ago. No trauma hx. It does not affect his range of motion. He also mentions that the pain in the right thigh has come back. We saw him for this about 6 weeks ago and treated it with a course of prednisone. This immediately resolved the pain but now it has come back for a week. Again no recnet trauma. The pain runs along the medial side of the right thigh from the groin all the way down to the knee. No swelling.    Review of Systems  Constitutional: Negative.   Musculoskeletal: Positive for joint swelling and arthralgias. Negative for back pain and gait problem.       Objective:   Physical Exam  Constitutional: He appears well-developed and well-nourished. No distress.  Musculoskeletal:  There is a small mobile slightly tender nodule along the flexor surface of the right index finger. Full ROM. The right thigh is unremarkable.           Assessment & Plan:  He has a ganglion cyst on the finger. This does not bother him very much right now so he will simply follow up prn. As for the thigh pain, he has seen the [facility] in the past, and he plans to set up an appt with them to evaluate this soon. He will use Ibuprofen in the meantime

## 2012-06-24 ENCOUNTER — Ambulatory Visit: Payer: Medicare Other | Admitting: Family Medicine

## 2012-06-27 ENCOUNTER — Encounter: Payer: Self-pay | Admitting: Family Medicine

## 2012-12-15 ENCOUNTER — Other Ambulatory Visit: Payer: Self-pay

## 2013-03-17 ENCOUNTER — Other Ambulatory Visit: Payer: Self-pay

## 2013-07-26 ENCOUNTER — Telehealth: Payer: Self-pay | Admitting: Family Medicine

## 2013-07-26 NOTE — Telephone Encounter (Signed)
[facility] - [place], [place] - [address] requesting re-fill on fluticasone (FLONASE) 50 MCG/ACT nasal spray

## 2013-07-27 MED ORDER — FLUTICASONE PROPIONATE 50 MCG/ACT NA SUSP: NASAL | Status: AC

## 2013-07-27 NOTE — Telephone Encounter (Signed)
I sent script e-scribe. 

## 2013-11-21 IMAGING — CR DG CHEST 2V
3 series · 3 of 3 positions shown · non-contrast
Comparison: Chest x-ray of 10/23/2011

CLINICAL DATA: Cough, former smoking history

CHEST - 2 VIEW

[view not recorded (1 of 3)]
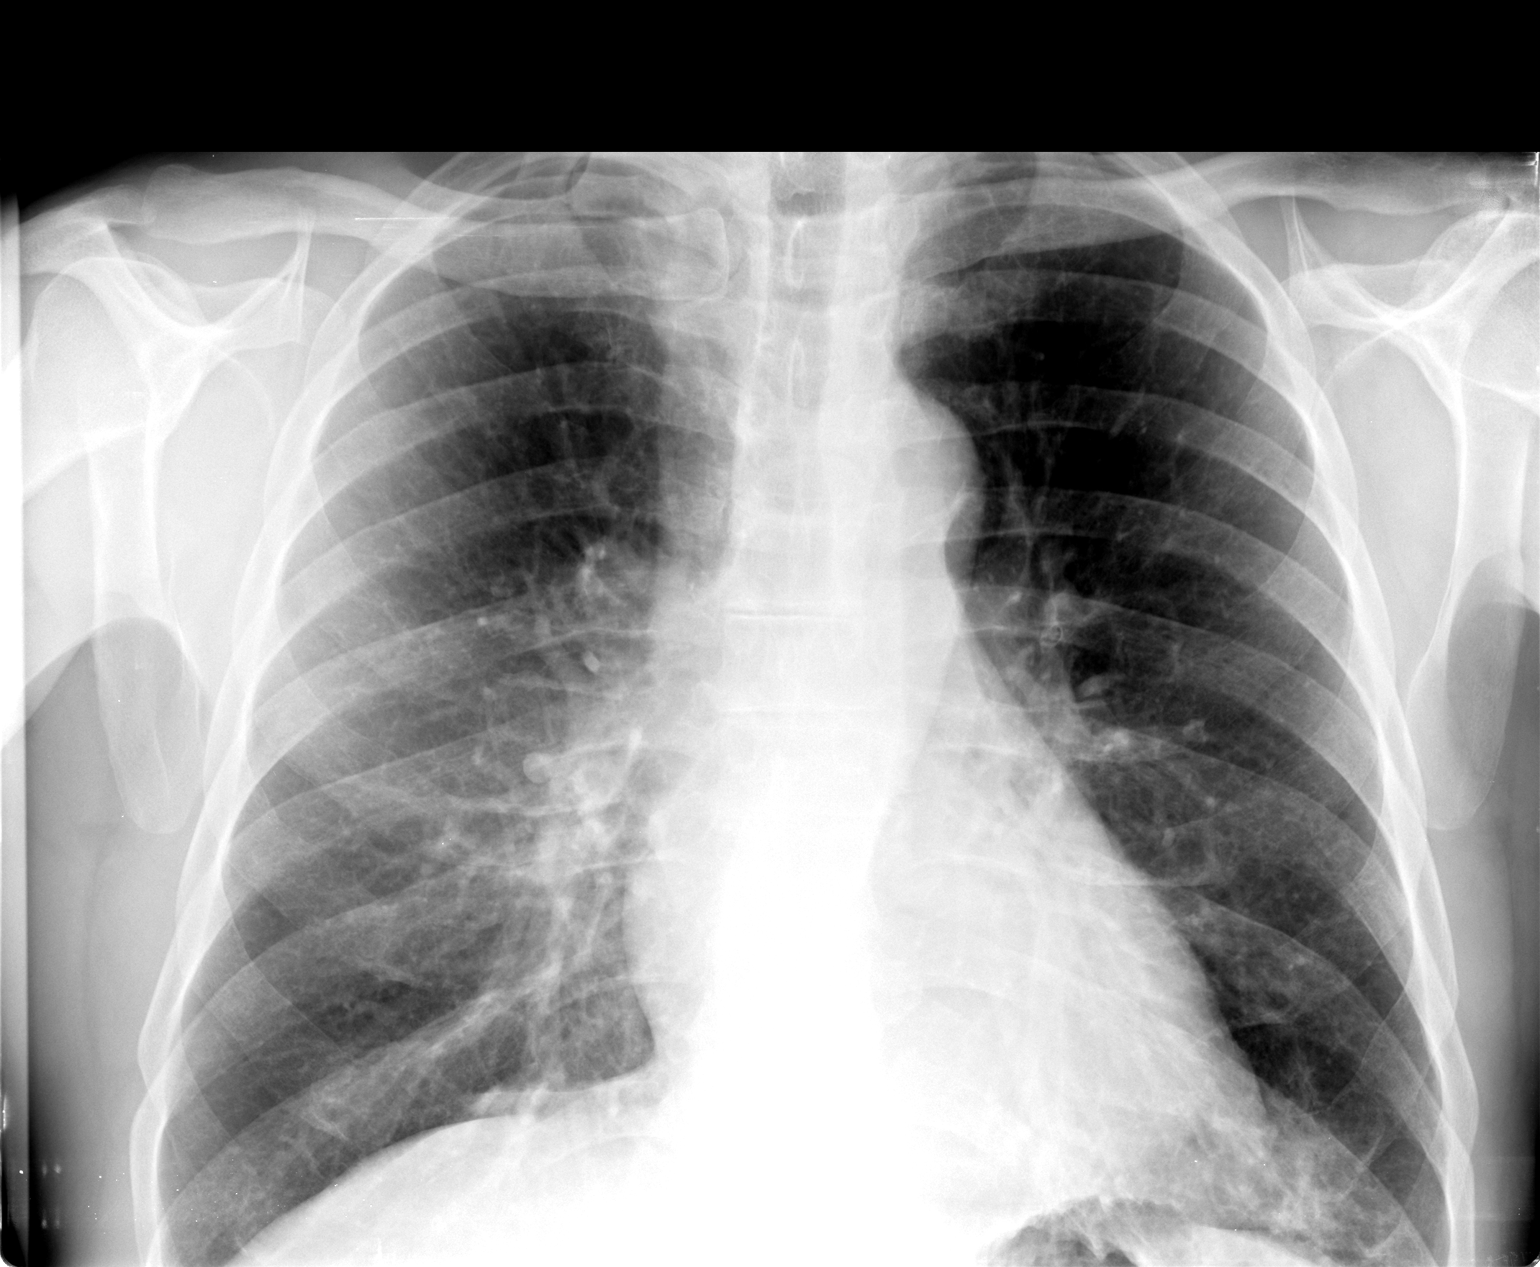

[view not recorded (2 of 3)]
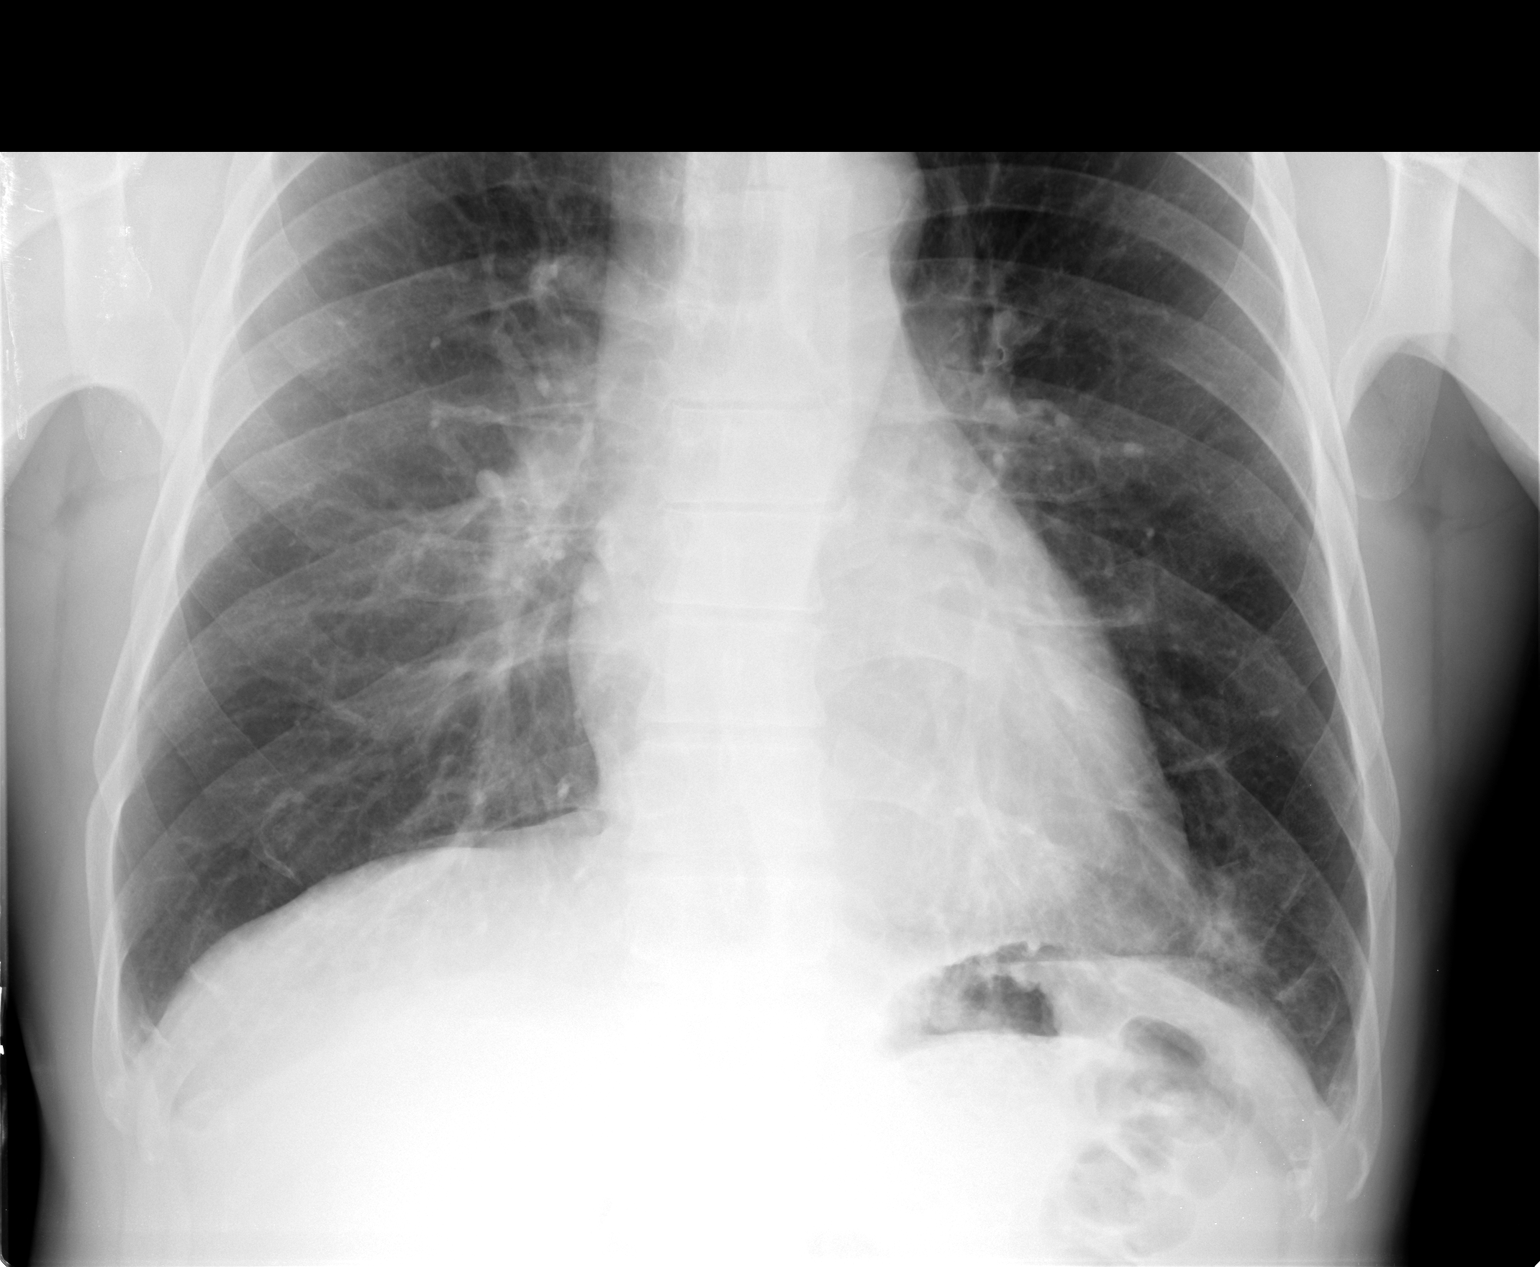

[view not recorded (3 of 3)]
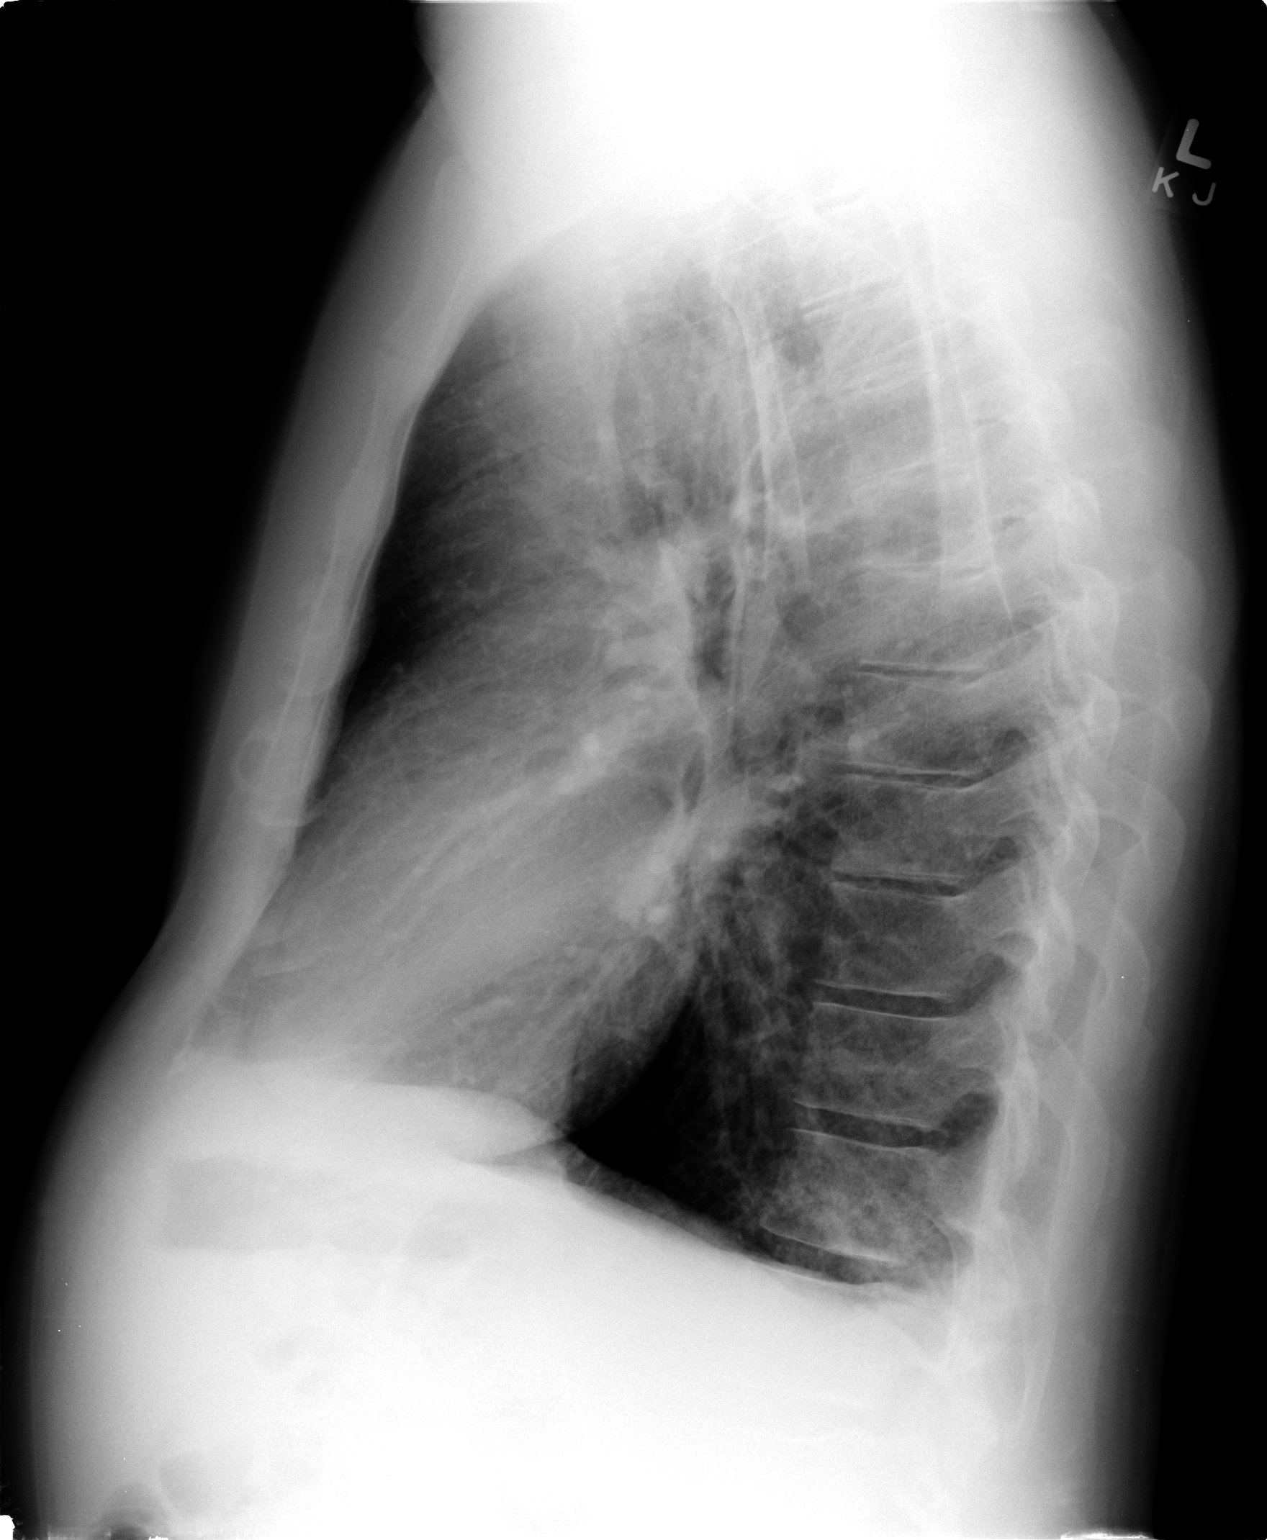

[3 of 3 positions shown; findings below may reference images not displayed]

FINDINGS: There is parenchymal opacity posteriorly at the left lung
base within the left lower lobe consistent with pneumonia.  The
remainder of the lungs are clear and somewhat hyperaerated.
Mediastinal contours are stable.  The heart is within normal limits
in size.  No bony abnormality is seen.
IMPRESSION: Left lower lobe pneumonia posteriorly.  Recommend follow-up to
ensure clearing.

## 2014-11-06 ENCOUNTER — Other Ambulatory Visit: Payer: Self-pay

## 2017-01-29 ENCOUNTER — Encounter: Payer: Self-pay | Admitting: Family Medicine
